# Patient Record
Sex: Male | Born: 1995 | Race: White | Hispanic: No | Marital: Single | Smoking: Never smoker
Health system: Southern US, Community
[De-identification: ages and names within clinical notes are randomized; demographics above are authoritative.]

## PROBLEM LIST (undated history)

## (undated) DIAGNOSIS — Z9189 Other specified personal risk factors, not elsewhere classified: Secondary | ICD-10-CM

## (undated) DIAGNOSIS — L709 Acne, unspecified: Secondary | ICD-10-CM

## (undated) DIAGNOSIS — R319 Hematuria, unspecified: Secondary | ICD-10-CM

## (undated) DIAGNOSIS — J45909 Unspecified asthma, uncomplicated: Secondary | ICD-10-CM

## (undated) DIAGNOSIS — J302 Other seasonal allergic rhinitis: Secondary | ICD-10-CM

## (undated) DIAGNOSIS — F909 Attention-deficit hyperactivity disorder, unspecified type: Secondary | ICD-10-CM

## (undated) HISTORY — PX: ADENOIDECTOMY: SUR15

## (undated) HISTORY — PX: MYRINGOTOMY WITH TUBE PLACEMENT: SHX5663

## (undated) HISTORY — PX: TONSILLECTOMY: SUR1361

---
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
## 2014-04-30 ENCOUNTER — Other Ambulatory Visit (HOSPITAL_COMMUNITY): Payer: Self-pay | Admitting: Gastroenterology

## 2014-04-30 DIAGNOSIS — R112 Nausea with vomiting, unspecified: Secondary | ICD-10-CM

## 2014-05-13 ENCOUNTER — Ambulatory Visit (HOSPITAL_COMMUNITY): Payer: Self-pay

## 2014-05-29 ENCOUNTER — Ambulatory Visit (HOSPITAL_COMMUNITY): Payer: Self-pay | Attending: Gastroenterology

## 2014-09-13 ENCOUNTER — Encounter (HOSPITAL_BASED_OUTPATIENT_CLINIC_OR_DEPARTMENT_OTHER): Payer: Self-pay | Admitting: Emergency Medicine

## 2014-09-13 ENCOUNTER — Emergency Department (HOSPITAL_BASED_OUTPATIENT_CLINIC_OR_DEPARTMENT_OTHER): Payer: BLUE CROSS/BLUE SHIELD

## 2014-09-13 ENCOUNTER — Emergency Department (HOSPITAL_BASED_OUTPATIENT_CLINIC_OR_DEPARTMENT_OTHER)
Admission: EM | Admit: 2014-09-13 | Discharge: 2014-09-14 | Disposition: A | Discharge status: Home / Self Care | Payer: BLUE CROSS/BLUE SHIELD | Attending: Emergency Medicine | Admitting: Emergency Medicine

## 2014-09-13 DIAGNOSIS — Y998 Other external cause status: Secondary | ICD-10-CM | POA: Diagnosis not present

## 2014-09-13 DIAGNOSIS — W228XXA Striking against or struck by other objects, initial encounter: Secondary | ICD-10-CM | POA: Diagnosis not present

## 2014-09-13 DIAGNOSIS — Z79899 Other long term (current) drug therapy: Secondary | ICD-10-CM | POA: Insufficient documentation

## 2014-09-13 DIAGNOSIS — F909 Attention-deficit hyperactivity disorder, unspecified type: Secondary | ICD-10-CM | POA: Diagnosis not present

## 2014-09-13 DIAGNOSIS — S060X0A Concussion without loss of consciousness, initial encounter: Secondary | ICD-10-CM | POA: Insufficient documentation

## 2014-09-13 DIAGNOSIS — Y9389 Activity, other specified: Secondary | ICD-10-CM | POA: Insufficient documentation

## 2014-09-13 DIAGNOSIS — J45909 Unspecified asthma, uncomplicated: Secondary | ICD-10-CM | POA: Insufficient documentation

## 2014-09-13 DIAGNOSIS — S199XXA Unspecified injury of neck, initial encounter: Secondary | ICD-10-CM | POA: Insufficient documentation

## 2014-09-13 DIAGNOSIS — Y9289 Other specified places as the place of occurrence of the external cause: Secondary | ICD-10-CM | POA: Diagnosis not present

## 2014-09-13 DIAGNOSIS — Z872 Personal history of diseases of the skin and subcutaneous tissue: Secondary | ICD-10-CM | POA: Insufficient documentation

## 2014-09-13 DIAGNOSIS — S0990XA Unspecified injury of head, initial encounter: Secondary | ICD-10-CM | POA: Diagnosis present

## 2014-09-13 MED ORDER — IBUPROFEN 800 MG PO TABS
800.0000 mg | ORAL_TABLET | Freq: Once | ORAL | Status: AC
  Administered 2014-09-13: 800 mg via ORAL
  Filled 2014-09-13: qty 1
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
## 2014-09-13 NOTE — ED Notes (Signed)
Patient reports that he hit his head on the cement while doing a back flip. Reports that he is having Headache and neck pain after landing on his head. Reports that he has LOC, Reports that this happened about 2 hours ago and since he has had some confusion

## 2014-09-14 NOTE — Discharge Instructions (Signed)
Concussion  A concussion, or closed-head injury, is a brain injury caused by a direct blow to the head or by a quick and sudden movement (jolt) of the head or neck. Concussions are usually not life-threatening. Even so, the effects of a concussion can be serious. If you have had a concussion before, you are more likely to experience concussion-like symptoms after a direct blow to the head.   CAUSES  · Direct blow to the head, such as from running into another player during a soccer game, being hit in a fight, or hitting your head on a hard surface.  · A jolt of the head or neck that causes the brain to move back and forth inside the skull, such as in a car crash.  SIGNS AND SYMPTOMS  The signs of a concussion can be hard to notice. Early on, they may be missed by you, family members, and health care providers. You may look fine but act or feel differently.  Symptoms are usually temporary, but they may last for days, weeks, or even longer. Some symptoms may appear right away while others may not show up for hours or days. Every head injury is different. Symptoms include:  · Mild to moderate headaches that will not go away.  · A feeling of pressure inside your head.  · Having more trouble than usual:  ¨ Learning or remembering things you have heard.  ¨ Answering questions.  ¨ Paying attention or concentrating.  ¨ Organizing daily tasks.  ¨ Making decisions and solving problems.  · Slowness in thinking, acting or reacting, speaking, or reading.  · Getting lost or being easily confused.  · Feeling tired all the time or lacking energy (fatigued).  · Feeling drowsy.  · Sleep disturbances.  ¨ Sleeping more than usual.  ¨ Sleeping less than usual.  ¨ Trouble falling asleep.  ¨ Trouble sleeping (insomnia).  · Loss of balance or feeling lightheaded or dizzy.  · Nausea or vomiting.  · Numbness or tingling.  · Increased sensitivity to:  ¨ Sounds.  ¨ Lights.  ¨ Distractions.  · Vision problems or eyes that tire  easily.  · Diminished sense of taste or smell.  · Ringing in the ears.  · Mood changes such as feeling sad or anxious.  · Becoming easily irritated or angry for little or no reason.  · Lack of motivation.  · Seeing or hearing things other people do not see or hear (hallucinations).  DIAGNOSIS  Your health care provider can usually diagnose a concussion based on a description of your injury and symptoms. He or she will ask whether you passed out (lost consciousness) and whether you are having trouble remembering events that happened right before and during your injury.  Your evaluation might include:  · A brain scan to look for signs of injury to the brain. Even if the test shows no injury, you may still have a concussion.  · Blood tests to be sure other problems are not present.  TREATMENT  · Concussions are usually treated in an emergency department, in urgent care, or at a clinic. You may need to stay in the hospital overnight for further treatment.  · Tell your health care provider if you are taking any medicines, including prescription medicines, over-the-counter medicines, and natural remedies. Some medicines, such as blood thinners (anticoagulants) and aspirin, may increase the chance of complications. Also tell your health care provider whether you have had alcohol or are taking illegal drugs. This information   may affect treatment.  · Your health care provider will send you home with important instructions to follow.  · How fast you will recover from a concussion depends on many factors. These factors include how severe your concussion is, what part of your brain was injured, your age, and how healthy you were before the concussion.  · Most people with mild injuries recover fully. Recovery can take time. In general, recovery is slower in older persons. Also, persons who have had a concussion in the past or have other medical problems may find that it takes longer to recover from their current injury.  HOME  CARE INSTRUCTIONS  General Instructions  · Carefully follow the directions your health care provider gave you.  · Only take over-the-counter or prescription medicines for pain, discomfort, or fever as directed by your health care provider.  · Take only those medicines that your health care provider has approved.  · Do not drink alcohol until your health care provider says you are well enough to do so. Alcohol and certain other drugs may slow your recovery and can put you at risk of further injury.  · If it is harder than usual to remember things, write them down.  · If you are easily distracted, try to do one thing at a time. For example, do not try to watch TV while fixing dinner.  · Talk with family members or close friends when making important decisions.  · Keep all follow-up appointments. Repeated evaluation of your symptoms is recommended for your recovery.  · Watch your symptoms and tell others to do the same. Complications sometimes occur after a concussion. Older adults with a brain injury may have a higher risk of serious complications, such as a blood clot on the brain.  · Tell your teachers, school nurse, school counselor, coach, athletic trainer, or work manager about your injury, symptoms, and restrictions. Tell them about what you can or cannot do. They should watch for:  ¨ Increased problems with attention or concentration.  ¨ Increased difficulty remembering or learning new information.  ¨ Increased time needed to complete tasks or assignments.  ¨ Increased irritability or decreased ability to cope with stress.  ¨ Increased symptoms.  · Rest. Rest helps the brain to heal. Make sure you:  ¨ Get plenty of sleep at night. Avoid staying up late at night.  ¨ Keep the same bedtime hours on weekends and weekdays.  ¨ Rest during the day. Take daytime naps or rest breaks when you feel tired.  · Limit activities that require a lot of thought or concentration. These include:  ¨ Doing homework or job-related  work.  ¨ Watching TV.  ¨ Working on the computer.  · Avoid any situation where there is potential for another head injury (football, hockey, soccer, basketball, martial arts, downhill snow sports and horseback riding). Your condition will get worse every time you experience a concussion. You should avoid these activities until you are evaluated by the appropriate follow-up health care providers.  Returning To Your Regular Activities  You will need to return to your normal activities slowly, not all at once. You must give your body and brain enough time for recovery.  · Do not return to sports or other athletic activities until your health care provider tells you it is safe to do so.  · Ask your health care provider when you can drive, ride a bicycle, or operate heavy machinery. Your ability to react may be slower after a   brain injury. Never do these activities if you are dizzy.  · Ask your health care provider about when you can return to work or school.  Preventing Another Concussion  It is very important to avoid another brain injury, especially before you have recovered. In rare cases, another injury can lead to permanent brain damage, brain swelling, or death. The risk of this is greatest during the first 7-10 days after a head injury. Avoid injuries by:  · Wearing a seat belt when riding in a car.  · Drinking alcohol only in moderation.  · Wearing a helmet when biking, skiing, skateboarding, skating, or doing similar activities.  · Avoiding activities that could lead to a second concussion, such as contact or recreational sports, until your health care provider says it is okay.  · Taking safety measures in your home.  ¨ Remove clutter and tripping hazards from floors and stairways.  ¨ Use grab bars in bathrooms and handrails by stairs.  ¨ Place non-slip mats on floors and in bathtubs.  ¨ Improve lighting in dim areas.  SEEK MEDICAL CARE IF:  · You have increased problems paying attention or  concentrating.  · You have increased difficulty remembering or learning new information.  · You need more time to complete tasks or assignments than before.  · You have increased irritability or decreased ability to cope with stress.  · You have more symptoms than before.  Seek medical care if you have any of the following symptoms for more than 2 weeks after your injury:  · Lasting (chronic) headaches.  · Dizziness or balance problems.  · Nausea.  · Vision problems.  · Increased sensitivity to noise or light.  · Depression or mood swings.  · Anxiety or irritability.  · Memory problems.  · Difficulty concentrating or paying attention.  · Sleep problems.  · Feeling tired all the time.  SEEK IMMEDIATE MEDICAL CARE IF:  · You have severe or worsening headaches. These may be a sign of a blood clot in the brain.  · You have weakness (even if only in one hand, leg, or part of the face).  · You have numbness.  · You have decreased coordination.  · You vomit repeatedly.  · You have increased sleepiness.  · One pupil is larger than the other.  · You have convulsions.  · You have slurred speech.  · You have increased confusion. This may be a sign of a blood clot in the brain.  · You have increased restlessness, agitation, or irritability.  · You are unable to recognize people or places.  · You have neck pain.  · It is difficult to wake you up.  · You have unusual behavior changes.  · You lose consciousness.  MAKE SURE YOU:  · Understand these instructions.  · Will watch your condition.  · Will get help right away if you are not doing well or get worse.  Document Released: 03/12/2003 Document Revised: 12/25/2012 Document Reviewed: 07/12/2012  Exit[employer]® Patient Information ©2015 [employer]. This information is not intended to replace advice given to you by your health care provider. Make sure you discuss any questions you have with your health care provider.

## 2015-03-15 ENCOUNTER — Encounter (HOSPITAL_COMMUNITY): Payer: Self-pay | Admitting: Emergency Medicine

## 2015-03-15 ENCOUNTER — Emergency Department (HOSPITAL_COMMUNITY): Payer: BLUE CROSS/BLUE SHIELD

## 2015-03-15 ENCOUNTER — Emergency Department (HOSPITAL_COMMUNITY)
Admission: EM | Admit: 2015-03-15 | Discharge: 2015-03-15 | Disposition: A | Discharge status: Home / Self Care | Payer: BLUE CROSS/BLUE SHIELD | Attending: Emergency Medicine | Admitting: Emergency Medicine

## 2015-03-15 DIAGNOSIS — W01198A Fall on same level from slipping, tripping and stumbling with subsequent striking against other object, initial encounter: Secondary | ICD-10-CM | POA: Diagnosis not present

## 2015-03-15 DIAGNOSIS — J45909 Unspecified asthma, uncomplicated: Secondary | ICD-10-CM | POA: Insufficient documentation

## 2015-03-15 DIAGNOSIS — Y9289 Other specified places as the place of occurrence of the external cause: Secondary | ICD-10-CM | POA: Insufficient documentation

## 2015-03-15 DIAGNOSIS — Y9389 Activity, other specified: Secondary | ICD-10-CM | POA: Insufficient documentation

## 2015-03-15 DIAGNOSIS — Y998 Other external cause status: Secondary | ICD-10-CM | POA: Insufficient documentation

## 2015-03-15 DIAGNOSIS — Z872 Personal history of diseases of the skin and subcutaneous tissue: Secondary | ICD-10-CM | POA: Diagnosis not present

## 2015-03-15 DIAGNOSIS — S52591A Other fractures of lower end of right radius, initial encounter for closed fracture: Secondary | ICD-10-CM | POA: Insufficient documentation

## 2015-03-15 DIAGNOSIS — S6991XA Unspecified injury of right wrist, hand and finger(s), initial encounter: Secondary | ICD-10-CM | POA: Diagnosis present

## 2015-03-15 DIAGNOSIS — Z79899 Other long term (current) drug therapy: Secondary | ICD-10-CM | POA: Insufficient documentation

## 2015-03-15 DIAGNOSIS — F909 Attention-deficit hyperactivity disorder, unspecified type: Secondary | ICD-10-CM | POA: Diagnosis not present

## 2015-03-15 DIAGNOSIS — S5291XA Unspecified fracture of right forearm, initial encounter for closed fracture: Secondary | ICD-10-CM

## 2015-03-15 NOTE — ED Notes (Signed)
Patient transported to X-ray 

## 2015-03-15 NOTE — ED Notes (Signed)
Pt reports that he broke his right wrist Thursday and has had numbness since. Today pt has no feeling to right pointer finger and right middle finger. Pt has splint to right arm.

## 2015-03-15 NOTE — Consult Note (Signed)
Reason for Consult:right  distal radius fracture Referring Physician: Hassie Bruceickering  Jeffery Morales is an 20 y.o. male.  HPI: s/p fall of porch last Thursday while [place] [facility] with reduction of distal radius fracture now with worsening pain and numbness  Past Medical History  Diagnosis Date  . ADHD (attention deficit hyperactivity disorder)     ADD  . Asthma     hx of asthma, has albuterol that he uses w/ assoc bronchitis  . At risk for impaired digestion     has an appt to see gastroenterologist  . Hematuria     w/ dysuria  . Acne   . Seasonal allergies     Past Surgical History  Procedure Laterality Date  . Adenoidectomy    . Tonsillectomy    . Myringotomy with tube placement    . Cystoscopy with urethral dilatation N/A 02/27/2012    Procedure: CYSTOSCOPY WITH URETHRAL DILATATION;  Surgeon: Valetta Fulleravid S Grapey, MD;  [facility];  Service: Urology;  Laterality: N/A;  30 MIN  URETHRAL DILATION     Family History  Problem Relation Age of Onset  . Hypertension Mother   . Heart disease Maternal Grandmother   . Hyperlipidemia Maternal Grandmother   . Diabetes Maternal Grandmother   . Heart disease Maternal Grandfather   . Hyperlipidemia Maternal Grandfather   . Diabetes Maternal Grandfather     Social History:  reports that he has never smoked. He does not have any smokeless tobacco history on file. He reports that he drinks alcohol. His drug history is not on file.  Allergies: No Known Allergies  Medications: Scheduled:  No results found for this or any previous visit (from the past 48 hour(s)).  Dg Wrist Complete Right  03/15/2015  CLINICAL DATA:  Wrist fracture 4 days ago when fell from balcony. No missed index finger and middle finger. In a soft cast. EXAM: RIGHT WRIST - COMPLETE 3+ VIEW COMPARISON:  None. FINDINGS: In cast views of the right wrist demonstrate comminuted intra-articular distal right radial fracture. The distal fragments are  displaced posteriorly approximately 9 mm. No subluxation or dislocation. IMPRESSION: Significantly displaced and comminuted distal right radial fracture as above. Electronically Signed   By: Charlett NoseKevin  Dover M.D.   On: 03/15/2015 17:51    Review of Systems  All other systems reviewed and are negative.  Blood pressure 143/110, pulse 51, temperature 97.7 F (36.5 C), temperature source Oral, resp. rate 18, height 5\' 11"  (1.803 m), weight 91.882 kg (202 lb 9 oz), SpO2 98 %. Physical Exam  Constitutional: He is oriented to person, place, and time. He appears well-developed and well-nourished.  HENT:  Head: Normocephalic and atraumatic.  Neck: Normal range of motion.  Cardiovascular: Normal rate.   Respiratory: Effort normal.  Musculoskeletal:       Right wrist: He exhibits tenderness, bony tenderness and swelling.  Dorsally displaced right distal radius fracture with increasing numbness in median distribution over last 24 hours  Neurological: He is alert and oriented to person, place, and time.  Skin: Skin is warm.  Psychiatric: He has a normal mood and affect. His behavior is normal. Judgment and thought content normal.    Assessment/Plan: As above  Will loosen splint and schedule for ORIF/CTR tomorrow as outpatient  Patient should be NPO after midnight tonight and call my office Monday at 845 and ask for Helmut Musterlicia  She will instruct patient in surgical place and time  [facility],Kier Smead A 03/15/2015, 6:43 PM

## 2015-03-15 NOTE — Discharge Instructions (Signed)
Forearm Fracture A forearm fracture is a break in one or both of the bones of your arm that are between the elbow and the wrist. Your forearm is made up of two bones called the radius and the ulna. Some forearm fractures will require surgery. HOME CARE If You Have a Cast:  Do not stick anything inside the cast to scratch your skin.  Check the skin around the cast every day. Tell your doctor about any concerns. You may put lotion on dry skin around the edges of the cast, but not on the skin underneath the cast. If You Have a Splint:  Wear it as told by your doctor. Remove it only as told by your doctor.  Loosen the splint if your fingers become numb and tingle, or if they turn cold and blue. Bathing  Cover the cast or splint with a watertight plastic bag to protect it from water while you take a bath or a shower. Do not let the cast or splint get wet. Managing Pain, Stiffness, and Swelling  If told, apply ice to the injured area:  Put ice in a plastic bag.  Place a towel between your skin and the bag.  Leave the ice on for 20 minutes, 2-3 times a day.  Move your fingers often to avoid stiffness and to lessen swelling.  Raise the injured area above the level of your heart while you are sitting or lying down. Driving  Do not drive or use heavy machinery while taking pain medicine.  Do not drive while wearing a cast or splint on a hand that you use for driving. Activity  Return to your normal activities as told by your doctor. Ask your doctor what activities are safe for you.  Do range-of-motion exercises only as told by your doctor. Safety  Do not use your injured limb to support your body weight until your doctor says that you can. General Instructions  Do not put pressure on any part of the cast or splint until it is fully hardened. This may take several hours.  Keep the cast or splint clean and dry.  Do not use any tobacco products, including cigarettes, chewing  tobacco, or electronic cigarettes. Tobacco can delay bone healing. If you need help quitting, ask your doctor.  Take medicines only as told by your doctor.  Keep all follow-up visits as told by your doctor. This is important. GET HELP IF:  Your pain medicine is not helping.  Your cast breaks or gets damaged.  Your cast gets loose.  Your cast feels too tight.  Your cast gets wet.  You have more severe pain or swelling than you did before the cast.  You have severe pain when you stretch your fingers.  You continue to have pain or stiffness in your elbow or your wrist after your cast is taken off. GET HELP RIGHT AWAY IF:   You cannot move your fingers.  You lose feeling in your fingers or your hand.  Your hand or your fingers turn cold and pale or blue.  You notice a bad smell coming from your cast.  You have fluid or drainage from underneath your cast.  You have new stains from blood, fluid, or drainage that is coming through your cast.   This information is not intended to replace advice given to you by your health care provider. Make sure you discuss any questions you have with your health care provider.   Contact Dr. Mina MarbleWeingold office tomorrow morning at  8:45 AM. Ask for Helmut Muster. Your surgery will be scheduled for later tomorrow afternoon. Continue taking home pain medication as needed. Keep arm elevated as much as possible. Return to the ED if you experience fever, severe worsening of your symptoms, inability to move your fingers.

## 2015-03-15 NOTE — ED Notes (Signed)
Dr. Mina MarbleWeingold coming to see patient.

## 2015-03-15 NOTE — ED Provider Notes (Signed)
CSN: [id number]     Arrival date & time 03/15/15  1543 History   First MD Initiated Contact with Patient 03/15/15 1600     Chief Complaint  Patient presents with  . Numbness     (Consider location/radiation/quality/duration/timing/severity/associated sxs/prior Treatment) HPI  Jeffery Morales is a 20 y.o M With no significant past medical history who presents to the emergency department today complaining of wrist pain and finger numbness. Patient states that 5 days ago he was on spring break in [place] and he fell on an outstretched hand and broke his right wrist. While intoxicated he attempted to reset his wrist. The following day he was seen [place] clinic in [place] who reduced the fracture and placed a splint on it. Patient was told that he may require surgery. S he lives in [place] he came home to follow-up with a hand surgeon here but has not done so yet. Yesterday patient states that his hand became much more swollen and is now having complete numbness in his right middle finger. Patient has been taking oxycodone and ibuprofen at home with minimal relief of his symptoms.   Past Medical History  Diagnosis Date  . ADHD (attention deficit hyperactivity disorder)     ADD  . Asthma     hx of asthma, has albuterol that he uses w/ assoc bronchitis  . At risk for impaired digestion     has an appt to see gastroenterologist  . Hematuria     w/ dysuria  . Acne   . Seasonal allergies    Past Surgical History  Procedure Laterality Date  . Adenoidectomy    . Tonsillectomy    . Myringotomy with tube placement    . Cystoscopy with urethral dilatation N/A 02/27/2012    Procedure: CYSTOSCOPY WITH URETHRAL DILATATION;  Surgeon: Valetta Fulleravid S Grapey, MD;  [facility];  Service: Urology;  Laterality: N/A;  30 MIN  URETHRAL DILATION    Family History  Problem Relation Age of Onset  . Hypertension Mother   . Heart disease Maternal Grandmother   .  Hyperlipidemia Maternal Grandmother   . Diabetes Maternal Grandmother   . Heart disease Maternal Grandfather   . Hyperlipidemia Maternal Grandfather   . Diabetes Maternal Grandfather    Social History  Substance Use Topics  . Smoking status: Never Smoker   . Smokeless tobacco: None  . Alcohol Use: Yes     Comment: per mom    Review of Systems  All other systems reviewed and are negative.     Allergies  Review of patient's allergies indicates no known allergies.  Home Medications   Prior to Admission medications   Medication Sig Start Date End Date Taking? Authorizing Provider  albuterol-ipratropium (COMBIVENT) 18-103 MCG/ACT inhaler Inhale 2 puffs into the lungs every 6 (six) hours as needed for wheezing.    Historical Provider, MD  buPROPion (WELLBUTRIN XL) 300 MG 24 hr tablet Take 300 mg by mouth daily.    Historical Provider, MD  cetirizine (ZYRTEC) 10 MG tablet Take 10 mg by mouth daily.    Historical Provider, MD  lansoprazole (PREVACID) 15 MG capsule Take 15 mg by mouth daily.    Historical Provider, MD  methylphenidate (RITALIN) 10 MG tablet Take 10 mg by mouth at bedtime as needed.    Historical Provider, MD  sertraline (ZOLOFT) 50 MG tablet Take 50 mg by mouth daily.    Historical Provider, MD   BP 143/110 mmHg  Pulse 51  Temp(Src) 97.7 F (36.5 C) (Oral)  Resp 18  Ht  (1.803 m)  Wt 91.882 kg  BMI 28.26 kg/m2  SpO2 98% Physical Exam  Constitutional: He is oriented to person, place, and time. He appears well-developed and well-nourished. No distress.  HENT:  Head: Normocephalic and atraumatic.  Eyes: Conjunctivae are normal. Right eye exhibits no discharge. Left eye exhibits no discharge. No scleral icterus.  Cardiovascular: Normal rate.   Pulmonary/Chest: Effort normal.  Musculoskeletal:  Obvious bony deformity of right wrist with volar injury. Significant swelling of digits with ecchymoses. Intact distal pulses. Good cap refill. Decreased sensation  in right fourth digit. Complete lack of light touch and pressure sensation in right middle digit. Sharp sensation intact. No evidence of tendon injury. No elbow tenderness.  Neurological: He is alert and oriented to person, place, and time. Coordination normal.  Skin: Skin is warm and dry. No rash noted. He is not diaphoretic. No erythema. No pallor.  Psychiatric: He has a normal mood and affect. His behavior is normal.  Nursing note and vitals reviewed.   ED Course  Procedures (including critical care time) Labs Review Labs Reviewed - No data to display  Imaging Review Dg Wrist Complete Right  03/15/2015  CLINICAL DATA:  Wrist fracture 4 days ago when fell from balcony. No missed index finger and middle finger. In a soft cast. EXAM: RIGHT WRIST - COMPLETE 3+ VIEW COMPARISON:  None. FINDINGS: In cast views of the right wrist demonstrate comminuted intra-articular distal right radial fracture. The distal fragments are displaced posteriorly approximately 9 mm. No subluxation or dislocation. IMPRESSION: Significantly displaced and comminuted distal right radial fracture as above. Electronically Signed   By: Charlett Nose M.D.   On: 03/15/2015 17:51   I have personally reviewed and evaluated these images and lab results as part of my medical decision-making.   EKG Interpretation None      MDM   Final diagnoses:  Radial fracture, right, closed, initial encounter    X-ray repeated in the ED. Which reveals significantly displaced and comminuted distal right radial fracture. Patient does have good cap refill. Possible median nerve damage as his right index finger has significantly decreased sensation. Consulted Dr.Weingold who saw the patient on the emergency department. Patient is scheduled to have surgery tomorrow afternoon. Splint was readjusted today while in the ED. Patient has home narcotics to take. Return precautions outlined in patient discharge instructions.  Case discussed with Dr.  Rubin Payor who agrees with treatment plan.    Lester Kinsman Lowell, PA-C 03/16/15 1620  Benjiman Core, MD 03/17/15 [phone]

## 2020-01-06 DIAGNOSIS — J209 Acute bronchitis, unspecified: Secondary | ICD-10-CM | POA: Diagnosis not present

## 2020-02-04 DIAGNOSIS — S60222A Contusion of left hand, initial encounter: Secondary | ICD-10-CM | POA: Diagnosis not present

## 2020-02-13 ENCOUNTER — Other Ambulatory Visit: Payer: Self-pay | Admitting: Physician Assistant

## 2020-02-13 DIAGNOSIS — K861 Other chronic pancreatitis: Secondary | ICD-10-CM

## 2020-02-13 DIAGNOSIS — R1013 Epigastric pain: Secondary | ICD-10-CM

## 2020-02-13 DIAGNOSIS — E781 Pure hyperglyceridemia: Secondary | ICD-10-CM | POA: Diagnosis not present

## 2020-02-13 DIAGNOSIS — R112 Nausea with vomiting, unspecified: Secondary | ICD-10-CM

## 2020-02-19 DIAGNOSIS — K861 Other chronic pancreatitis: Secondary | ICD-10-CM | POA: Diagnosis not present

## 2020-03-08 ENCOUNTER — Other Ambulatory Visit: Payer: BLUE CROSS/BLUE SHIELD

## 2020-03-29 ENCOUNTER — Other Ambulatory Visit: Payer: Self-pay

## 2020-03-29 ENCOUNTER — Ambulatory Visit
Admission: RE | Admit: 2020-03-29 | Discharge: 2020-03-29 | Disposition: A | Discharge status: Home / Self Care | Payer: BC Managed Care – PPO | Source: Ambulatory Visit | Attending: Physician Assistant | Admitting: Physician Assistant

## 2020-03-29 DIAGNOSIS — N281 Cyst of kidney, acquired: Secondary | ICD-10-CM | POA: Diagnosis not present

## 2020-03-29 DIAGNOSIS — D1803 Hemangioma of intra-abdominal structures: Secondary | ICD-10-CM | POA: Diagnosis not present

## 2020-03-29 DIAGNOSIS — K862 Cyst of pancreas: Secondary | ICD-10-CM | POA: Diagnosis not present

## 2020-03-29 DIAGNOSIS — R1013 Epigastric pain: Secondary | ICD-10-CM

## 2020-03-29 DIAGNOSIS — K861 Other chronic pancreatitis: Secondary | ICD-10-CM

## 2020-03-29 DIAGNOSIS — R935 Abnormal findings on diagnostic imaging of other abdominal regions, including retroperitoneum: Secondary | ICD-10-CM | POA: Diagnosis not present

## 2020-03-29 DIAGNOSIS — R112 Nausea with vomiting, unspecified: Secondary | ICD-10-CM

## 2020-03-29 MED ORDER — GADOBENATE DIMEGLUMINE 529 MG/ML IV SOLN
17.0000 mL | Freq: Once | INTRAVENOUS | Status: AC | PRN
  Administered 2020-03-29: 17 mL via INTRAVENOUS

## 2020-04-30 ENCOUNTER — Other Ambulatory Visit (HOSPITAL_COMMUNITY): Payer: Self-pay | Admitting: Physician Assistant

## 2020-04-30 DIAGNOSIS — R1013 Epigastric pain: Secondary | ICD-10-CM | POA: Diagnosis not present

## 2020-04-30 DIAGNOSIS — K649 Unspecified hemorrhoids: Secondary | ICD-10-CM | POA: Diagnosis not present

## 2020-04-30 DIAGNOSIS — D649 Anemia, unspecified: Secondary | ICD-10-CM | POA: Diagnosis not present

## 2020-04-30 DIAGNOSIS — K219 Gastro-esophageal reflux disease without esophagitis: Secondary | ICD-10-CM | POA: Diagnosis not present

## 2020-04-30 DIAGNOSIS — K625 Hemorrhage of anus and rectum: Secondary | ICD-10-CM | POA: Diagnosis not present

## 2020-05-15 ENCOUNTER — Encounter (HOSPITAL_COMMUNITY): Payer: Self-pay

## 2020-05-15 ENCOUNTER — Ambulatory Visit (HOSPITAL_COMMUNITY): Payer: BC Managed Care – PPO | Attending: Physician Assistant

## 2020-06-30 DIAGNOSIS — R1084 Generalized abdominal pain: Secondary | ICD-10-CM | POA: Diagnosis not present

## 2020-06-30 DIAGNOSIS — R110 Nausea: Secondary | ICD-10-CM | POA: Diagnosis not present

## 2020-06-30 DIAGNOSIS — K861 Other chronic pancreatitis: Secondary | ICD-10-CM | POA: Diagnosis not present

## 2020-08-03 DIAGNOSIS — R194 Change in bowel habit: Secondary | ICD-10-CM | POA: Diagnosis not present

## 2020-08-03 DIAGNOSIS — K219 Gastro-esophageal reflux disease without esophagitis: Secondary | ICD-10-CM | POA: Diagnosis not present

## 2020-08-03 DIAGNOSIS — R109 Unspecified abdominal pain: Secondary | ICD-10-CM | POA: Diagnosis not present

## 2020-09-15 ENCOUNTER — Ambulatory Visit: Payer: BC Managed Care – PPO | Admitting: Behavioral Health

## 2020-09-28 ENCOUNTER — Other Ambulatory Visit: Payer: Self-pay

## 2020-09-28 ENCOUNTER — Ambulatory Visit (INDEPENDENT_AMBULATORY_CARE_PROVIDER_SITE_OTHER): Payer: BC Managed Care – PPO | Admitting: Behavioral Health

## 2020-09-28 ENCOUNTER — Encounter: Payer: Self-pay | Admitting: Behavioral Health

## 2020-09-28 VITALS — BP 113/76 | HR 57 | Ht 71.0 in | Wt 194.0 lb

## 2020-09-28 DIAGNOSIS — F331 Major depressive disorder, recurrent, moderate: Secondary | ICD-10-CM

## 2020-09-28 DIAGNOSIS — F411 Generalized anxiety disorder: Secondary | ICD-10-CM

## 2020-09-28 DIAGNOSIS — F900 Attention-deficit hyperactivity disorder, predominantly inattentive type: Secondary | ICD-10-CM

## 2020-09-28 MED ORDER — DESVENLAFAXINE SUCCINATE ER 50 MG PO TB24: ORAL_TABLET | ORAL | 2 refills | Status: AC

## 2020-09-28 MED ORDER — METHYLPHENIDATE HCL ER (OSM) 18 MG PO TBCR: 18.0000 mg | EXTENDED_RELEASE_TABLET | Freq: Every day | ORAL | 0 refills | Status: DC | PRN

## 2020-09-28 MED ORDER — CLONAZEPAM 0.5 MG PO TABS: 0.5000 mg | ORAL_TABLET | Freq: Three times a day (TID) | ORAL | 1 refills | Status: DC | PRN

## 2020-09-28 NOTE — Progress Notes (Signed)
[facility] MD/PA/NP Initial Note  09/28/2020 4:35 PM Jeffery Morales  MRN:  [id number]  Chief Complaint:  Chief Complaint   Depression; Anxiety; Medication Problem; Establish Care; Patient Education     HPI:  25 year old male presents to this office for initial visit and to establish care. He says that he was previously getting psychiatric care in [place] while he was attending college. Say he recently moved back to the [place] area and just needed to establish care with a new provider to manage his medications. He says that he is stable and happy with his medications currently and does not want to make any changes. However he does need refills on his meds. He says that he was diagnosed with ADHD at young age but started to struggle with anxiety and depression while in college. He says he feels like most of his symptoms are under control. He says his anxiety today is 5/10 and depression is 4/10. He says he does sleep more than 8 hours per night. He endorses previous history of racing thoughts, lack of concentration, being more talkative than normal, irritability and increased interest in sex. He denies mania, no psychosis. No SI/HI.  Previous psychiatric medications: Wellbutrin Zoloft Prozac Luvox Pristiq    Visit Diagnosis:    ICD-10-CM   1. Generalized anxiety disorder  F41.1 clonazePAM (KLONOPIN) 0.5 MG tablet    desvenlafaxine (PRISTIQ) 50 MG 24 hr tablet    2. Major depressive disorder, recurrent episode, moderate (HCC)  F33.1 desvenlafaxine (PRISTIQ) 50 MG 24 hr tablet    3. Attention deficit hyperactivity disorder (ADHD), predominantly inattentive type  F90.0 methylphenidate 18 MG PO CR tablet      Past Psychiatric History: anxiety, depression   Past Medical History:  Past Medical History:  Diagnosis Date   Acne    ADHD (attention deficit hyperactivity disorder)    ADD   Asthma    hx of asthma, has albuterol that he uses w/ assoc bronchitis   At risk for impaired  digestion    has an appt to see gastroenterologist   Hematuria    w/ dysuria   Seasonal allergies     Past Surgical History:  Procedure Laterality Date   ADENOIDECTOMY     CYSTOSCOPY WITH URETHRAL DILATATION N/A 02/27/2012   Procedure: CYSTOSCOPY WITH URETHRAL DILATATION;  Surgeon: Valetta Fuller, MD;  Location: [facility];  Service: Urology;  Laterality: N/A;  30 MIN  URETHRAL DILATION    MYRINGOTOMY WITH TUBE PLACEMENT     TONSILLECTOMY      Family Psychiatric History: see chart   Family History:  Family History  Problem Relation Age of Onset   Hypertension Mother    Dementia Sister    Alcohol abuse Sister    Alcohol abuse Paternal Uncle    Heart disease Maternal Grandfather    Hyperlipidemia Maternal Grandfather    Diabetes Maternal Grandfather    Heart disease Maternal Grandmother    Hyperlipidemia Maternal Grandmother    Diabetes Maternal Grandmother    Alcohol abuse Paternal Grandfather     Social History:  Social History   Socioeconomic History   Marital status: Single    Spouse name: Not on file   Number of children: Not on file   Years of education: Not on file   Highest education level: Not on file  Occupational History   Not on file  Tobacco Use   Smoking status: Never   Smokeless tobacco: Never  Substance and Sexual Activity  Alcohol use: Yes    Comment: per mom   Drug use: Not Currently   Sexual activity: Not Currently    Comment: per mom  Other Topics Concern   Not on file  Social History Narrative   Not on file   Social Determinants of Health   Financial Resource Strain: Not on file  Food Insecurity: Not on file  Transportation Needs: Not on file  Physical Activity: Not on file  Stress: Not on file  Social Connections: Not on file    Allergies: No Known Allergies  Metabolic Disorder Labs: No results found for: HGBA1C, MPG No results found for: PROLACTIN No results found for: CHOL, TRIG, HDL, CHOLHDL, VLDL,  LDLCALC No results found for: TSH  Therapeutic Level Labs: No results found for: LITHIUM No results found for: VALPROATE No components found for:  CBMZ  Current Medications: Current Outpatient Medications  Medication Sig Dispense Refill   cetirizine (ZYRTEC) 10 MG tablet Take 10 mg by mouth daily.     dicyclomine (BENTYL) 10 MG capsule Take 10 mg by mouth every 6 (six) hours as needed.     fenofibrate micronized (LOFIBRA) 200 MG capsule Take 200 mg by mouth daily.     folic acid (FOLVITE) 1 MG tablet Take 1 tablet by mouth daily.     icosapent Ethyl (VASCEPA) 1 g capsule      metoprolol succinate (TOPROL-XL) 25 MG 24 hr tablet Take 1 tablet by mouth daily.     Multiple Vitamin (DAILY VITES) tablet Take 1 tablet by mouth daily.     omeprazole (PRILOSEC) 40 MG capsule Take 40 mg by mouth every morning.     ondansetron (ZOFRAN) 4 MG tablet Take 4 mg by mouth daily as needed.     rosuvastatin (CRESTOR) 20 MG tablet Take 1 tablet by mouth at bedtime.     albuterol-ipratropium (COMBIVENT) 18-103 MCG/ACT inhaler Inhale 2 puffs into the lungs every 6 (six) hours as needed for wheezing. (Patient not taking: Reported on 09/28/2020)     buPROPion (WELLBUTRIN XL) 300 MG 24 hr tablet Take 300 mg by mouth daily. (Patient not taking: Reported on 09/28/2020)     [START ON 10/15/2020] clonazePAM (KLONOPIN) 0.5 MG tablet Take 1 tablet (0.5 mg total) by mouth 3 (three) times daily as needed for anxiety. 30 tablet 1   desvenlafaxine (PRISTIQ) 50 MG 24 hr tablet SMARTSIG:1 Tablet(s) By Mouth Every Evening 30 tablet 2   lansoprazole (PREVACID) 15 MG capsule Take 15 mg by mouth daily. (Patient not taking: Reported on 09/28/2020)     methylphenidate (RITALIN) 10 MG tablet Take 10 mg by mouth at bedtime as needed. (Patient not taking: Reported on 09/28/2020)     [START ON 10/16/2020] methylphenidate 18 MG PO CR tablet Take 1 tablet (18 mg total) by mouth daily as needed. 30 tablet 0   sertraline (ZOLOFT) 50 MG tablet  Take 50 mg by mouth daily. (Patient not taking: Reported on 09/28/2020)     sucralfate (CARAFATE) 1 g tablet Take 1 g by mouth 2 (two) times daily.     traMADol (ULTRAM) 50 MG tablet Take 50 mg by mouth daily as needed.     ZENPEP 40000-126000 units CPEP Take by mouth.     No current facility-administered medications for this visit.    Medication Side Effects: none  Orders placed this visit:  No orders of the defined types were placed in this encounter.   Psychiatric Specialty Exam:  Review of Systems  Constitutional: Negative.  Gastrointestinal:  Positive for abdominal distention, blood in stool, constipation, diarrhea and nausea.  Neurological:  Positive for dizziness.  Hematological:  Bruises/bleeds easily.   Blood pressure 113/76, pulse (!) 57, height 5\' 11"  (1.803 m), weight 194 lb (88 kg).Body mass index is 27.06 kg/m.  General Appearance: Casual, Neat, and Well Groomed  Eye Contact:  Good  Speech:  Clear and Coherent and Talkative  Volume:  Normal  Mood:  Anxious  Affect:  Appropriate and Non-Congruent  Thought Process:  Coherent  Orientation:  Full (Time, Place, and Person)  Thought Content: Logical   Suicidal Thoughts:  No  Homicidal Thoughts:  No  Memory:  WNL  Judgement:  Good  Insight:  Good  Psychomotor Activity:  Normal  Concentration:  Concentration: Good  Recall:  Good  Fund of Knowledge: Good  Language: Good  Assets:  Desire for Improvement  ADL's:  Intact  Cognition: WNL  Prognosis:  Good   Screenings:  PHQ2-9    Flowsheet Row Office Visit from 09/28/2020 in [facility]  PHQ-2 Total Score 1       Receiving Psychotherapy: No   Treatment Plan/Recommendations:   Greater than 50% of  60 min face to face time with patient was spent on counseling and coordination of care. We discussed his long history of anxiety and depression. Discussed his previous psychiatric care plan while living in 09/30/2020. Continue Klonopin 0.5 mg three  times daily Continue Pristiq 50 mg tablet daily Continue Concerta 18 mg daily Will report  side effects or worsening symptoms promptly To follow up in 4 weeks to reassess Provided emergency contact information Discussed potential benefits, risks, and side effects of stimulants with patient to include increased heart rate, palpitations, insomnia, increased anxiety, increased irritability, or decreased appetite.  Instructed patient to contact office if experiencing any significant tolerability issues.  Discussed potential benefits, risk, and side effects of benzodiazepines to include potential risk of tolerance and dependence, as well as possible drowsiness.  Advised patient not to drive if experiencing drowsiness and to take lowest possible effective dose to minimize risk of dependence and tolerance.   Reviewed PDMP   [place], NP

## 2020-10-13 DIAGNOSIS — F900 Attention-deficit hyperactivity disorder, predominantly inattentive type: Secondary | ICD-10-CM | POA: Diagnosis not present

## 2020-10-13 DIAGNOSIS — F3342 Major depressive disorder, recurrent, in full remission: Secondary | ICD-10-CM | POA: Diagnosis not present

## 2020-10-13 DIAGNOSIS — F4001 Agoraphobia with panic disorder: Secondary | ICD-10-CM | POA: Diagnosis not present

## 2020-10-26 ENCOUNTER — Ambulatory Visit: Payer: BC Managed Care – PPO | Admitting: Behavioral Health

## 2020-12-01 DIAGNOSIS — F330 Major depressive disorder, recurrent, mild: Secondary | ICD-10-CM | POA: Diagnosis not present

## 2020-12-01 DIAGNOSIS — F411 Generalized anxiety disorder: Secondary | ICD-10-CM | POA: Diagnosis not present

## 2020-12-02 DIAGNOSIS — K861 Other chronic pancreatitis: Secondary | ICD-10-CM | POA: Diagnosis not present

## 2020-12-02 DIAGNOSIS — K219 Gastro-esophageal reflux disease without esophagitis: Secondary | ICD-10-CM | POA: Diagnosis not present

## 2020-12-02 DIAGNOSIS — E781 Pure hyperglyceridemia: Secondary | ICD-10-CM | POA: Diagnosis not present

## 2020-12-02 DIAGNOSIS — I10 Essential (primary) hypertension: Secondary | ICD-10-CM | POA: Diagnosis not present

## 2020-12-04 DIAGNOSIS — K861 Other chronic pancreatitis: Secondary | ICD-10-CM | POA: Diagnosis not present

## 2020-12-04 DIAGNOSIS — F32A Depression, unspecified: Secondary | ICD-10-CM | POA: Diagnosis not present

## 2020-12-04 DIAGNOSIS — Z79899 Other long term (current) drug therapy: Secondary | ICD-10-CM | POA: Diagnosis not present

## 2020-12-04 DIAGNOSIS — R7989 Other specified abnormal findings of blood chemistry: Secondary | ICD-10-CM | POA: Diagnosis not present

## 2020-12-04 DIAGNOSIS — F419 Anxiety disorder, unspecified: Secondary | ICD-10-CM | POA: Diagnosis not present

## 2020-12-04 DIAGNOSIS — R109 Unspecified abdominal pain: Secondary | ICD-10-CM | POA: Diagnosis not present

## 2020-12-11 DIAGNOSIS — R197 Diarrhea, unspecified: Secondary | ICD-10-CM | POA: Diagnosis not present

## 2020-12-11 DIAGNOSIS — R109 Unspecified abdominal pain: Secondary | ICD-10-CM | POA: Diagnosis not present

## 2020-12-11 DIAGNOSIS — R110 Nausea: Secondary | ICD-10-CM | POA: Diagnosis not present

## 2020-12-11 DIAGNOSIS — K2970 Gastritis, unspecified, without bleeding: Secondary | ICD-10-CM | POA: Diagnosis not present

## 2020-12-15 DIAGNOSIS — F900 Attention-deficit hyperactivity disorder, predominantly inattentive type: Secondary | ICD-10-CM | POA: Diagnosis not present

## 2020-12-15 DIAGNOSIS — F4001 Agoraphobia with panic disorder: Secondary | ICD-10-CM | POA: Diagnosis not present

## 2020-12-15 DIAGNOSIS — K869 Disease of pancreas, unspecified: Secondary | ICD-10-CM | POA: Diagnosis not present

## 2020-12-15 DIAGNOSIS — F3342 Major depressive disorder, recurrent, in full remission: Secondary | ICD-10-CM | POA: Diagnosis not present

## 2021-01-06 DIAGNOSIS — K2970 Gastritis, unspecified, without bleeding: Secondary | ICD-10-CM | POA: Diagnosis not present

## 2021-01-06 DIAGNOSIS — E781 Pure hyperglyceridemia: Secondary | ICD-10-CM | POA: Diagnosis not present

## 2021-01-06 DIAGNOSIS — K861 Other chronic pancreatitis: Secondary | ICD-10-CM | POA: Diagnosis not present

## 2021-01-06 DIAGNOSIS — K219 Gastro-esophageal reflux disease without esophagitis: Secondary | ICD-10-CM | POA: Diagnosis not present

## 2021-02-16 DIAGNOSIS — H5712 Ocular pain, left eye: Secondary | ICD-10-CM | POA: Diagnosis not present

## 2021-02-16 DIAGNOSIS — H53142 Visual discomfort, left eye: Secondary | ICD-10-CM | POA: Diagnosis not present

## 2021-03-03 DIAGNOSIS — M79644 Pain in right finger(s): Secondary | ICD-10-CM | POA: Diagnosis not present

## 2021-03-17 DIAGNOSIS — F331 Major depressive disorder, recurrent, moderate: Secondary | ICD-10-CM | POA: Diagnosis not present

## 2021-03-30 DIAGNOSIS — F331 Major depressive disorder, recurrent, moderate: Secondary | ICD-10-CM | POA: Diagnosis not present

## 2021-03-30 DIAGNOSIS — F900 Attention-deficit hyperactivity disorder, predominantly inattentive type: Secondary | ICD-10-CM | POA: Diagnosis not present

## 2021-03-30 DIAGNOSIS — F411 Generalized anxiety disorder: Secondary | ICD-10-CM | POA: Diagnosis not present

## 2021-04-04 DIAGNOSIS — S0591XA Unspecified injury of right eye and orbit, initial encounter: Secondary | ICD-10-CM | POA: Diagnosis not present

## 2021-04-27 DIAGNOSIS — F331 Major depressive disorder, recurrent, moderate: Secondary | ICD-10-CM | POA: Diagnosis not present

## 2021-04-27 DIAGNOSIS — F900 Attention-deficit hyperactivity disorder, predominantly inattentive type: Secondary | ICD-10-CM | POA: Diagnosis not present

## 2021-04-27 DIAGNOSIS — F411 Generalized anxiety disorder: Secondary | ICD-10-CM | POA: Diagnosis not present

## 2021-05-14 DIAGNOSIS — M6588 Other synovitis and tenosynovitis, other site: Secondary | ICD-10-CM | POA: Diagnosis not present

## 2021-05-27 DIAGNOSIS — S0500XA Injury of conjunctiva and corneal abrasion without foreign body, unspecified eye, initial encounter: Secondary | ICD-10-CM | POA: Diagnosis not present

## 2021-06-18 DIAGNOSIS — F13239 Sedative, hypnotic or anxiolytic dependence with withdrawal, unspecified: Secondary | ICD-10-CM | POA: Diagnosis not present

## 2021-06-18 DIAGNOSIS — Z79899 Other long term (current) drug therapy: Secondary | ICD-10-CM | POA: Diagnosis not present

## 2021-06-18 DIAGNOSIS — R112 Nausea with vomiting, unspecified: Secondary | ICD-10-CM | POA: Diagnosis not present

## 2021-06-18 DIAGNOSIS — F411 Generalized anxiety disorder: Secondary | ICD-10-CM | POA: Diagnosis not present

## 2021-06-18 DIAGNOSIS — F1320 Sedative, hypnotic or anxiolytic dependence, uncomplicated: Secondary | ICD-10-CM | POA: Diagnosis not present

## 2021-06-18 DIAGNOSIS — F419 Anxiety disorder, unspecified: Secondary | ICD-10-CM | POA: Diagnosis not present

## 2021-06-18 DIAGNOSIS — F1523 Other stimulant dependence with withdrawal: Secondary | ICD-10-CM | POA: Diagnosis not present

## 2021-06-18 DIAGNOSIS — R443 Hallucinations, unspecified: Secondary | ICD-10-CM | POA: Diagnosis not present

## 2021-06-21 DIAGNOSIS — F411 Generalized anxiety disorder: Secondary | ICD-10-CM | POA: Diagnosis not present

## 2021-06-21 DIAGNOSIS — F1310 Sedative, hypnotic or anxiolytic abuse, uncomplicated: Secondary | ICD-10-CM | POA: Diagnosis not present

## 2021-06-21 DIAGNOSIS — F900 Attention-deficit hyperactivity disorder, predominantly inattentive type: Secondary | ICD-10-CM | POA: Diagnosis not present

## 2021-06-21 DIAGNOSIS — F13239 Sedative, hypnotic or anxiolytic dependence with withdrawal, unspecified: Secondary | ICD-10-CM | POA: Diagnosis not present

## 2021-06-22 ENCOUNTER — Encounter (HOSPITAL_COMMUNITY): Payer: Self-pay | Admitting: Student

## 2021-06-22 ENCOUNTER — Other Ambulatory Visit (HOSPITAL_COMMUNITY)
Admission: EM | Admit: 2021-06-22 | Discharge: 2021-06-25 | Disposition: A | Discharge status: Home / Self Care | Payer: No Payment, Other | Attending: Psychiatry | Admitting: Psychiatry

## 2021-06-22 DIAGNOSIS — F411 Generalized anxiety disorder: Secondary | ICD-10-CM

## 2021-06-22 DIAGNOSIS — F13930 Sedative, hypnotic or anxiolytic use, unspecified with withdrawal, uncomplicated: Secondary | ICD-10-CM | POA: Diagnosis not present

## 2021-06-22 DIAGNOSIS — F1510 Other stimulant abuse, uncomplicated: Secondary | ICD-10-CM | POA: Diagnosis present

## 2021-06-22 DIAGNOSIS — F13239 Sedative, hypnotic or anxiolytic dependence with withdrawal, unspecified: Secondary | ICD-10-CM | POA: Insufficient documentation

## 2021-06-22 DIAGNOSIS — F1390 Sedative, hypnotic, or anxiolytic use, unspecified, uncomplicated: Secondary | ICD-10-CM

## 2021-06-22 DIAGNOSIS — Z20822 Contact with and (suspected) exposure to covid-19: Secondary | ICD-10-CM | POA: Insufficient documentation

## 2021-06-22 DIAGNOSIS — F17200 Nicotine dependence, unspecified, uncomplicated: Secondary | ICD-10-CM

## 2021-06-22 DIAGNOSIS — Z79899 Other long term (current) drug therapy: Secondary | ICD-10-CM | POA: Insufficient documentation

## 2021-06-22 DIAGNOSIS — F330 Major depressive disorder, recurrent, mild: Secondary | ICD-10-CM

## 2021-06-22 LAB — RESP PANEL BY RT-PCR (FLU A&B, COVID) ARPGX2
Influenza A by PCR: NEGATIVE
Influenza B by PCR: NEGATIVE
SARS Coronavirus 2 by RT PCR: NEGATIVE

## 2021-06-22 LAB — COMPREHENSIVE METABOLIC PANEL
ALT: 14 U/L (ref 0–44)
AST: 13 U/L — ABNORMAL LOW (ref 15–41)
Albumin: 5.2 g/dL — ABNORMAL HIGH (ref 3.5–5.0)
Alkaline Phosphatase: 39 U/L (ref 38–126)
Anion gap: 11 (ref 5–15)
BUN: 20 mg/dL (ref 6–20)
CO2: 24 mmol/L (ref 22–32)
Calcium: 10.1 mg/dL (ref 8.9–10.3)
Chloride: 108 mmol/L (ref 98–111)
Creatinine, Ser: 0.85 mg/dL (ref 0.61–1.24)
GFR, Estimated: >60 mL/min (ref >60)
Glucose, Bld: 106 mg/dL — ABNORMAL HIGH (ref 70–99)
Potassium: 4.1 mmol/L (ref 3.5–5.1)
Sodium: 143 mmol/L (ref 135–145)
Total Bilirubin: 0.9 mg/dL (ref 0.3–1.2)
Total Protein: 7.9 g/dL (ref 6.5–8.1)

## 2021-06-22 LAB — POCT URINE DRUG SCREEN - MANUAL ENTRY (I-SCREEN)
POC Amphetamine UR: NOT DETECTED
POC Buprenorphine (BUP): NOT DETECTED
POC Cocaine UR: NOT DETECTED
POC Marijuana UR: POSITIVE — AB
POC Methadone UR: NOT DETECTED
POC Methamphetamine UR: NOT DETECTED
POC Morphine: NOT DETECTED
POC Oxazepam (BZO): POSITIVE — AB
POC Oxycodone UR: NOT DETECTED
POC Secobarbital (BAR): NOT DETECTED

## 2021-06-22 LAB — URINALYSIS, ROUTINE W REFLEX MICROSCOPIC
Bilirubin Urine: NEGATIVE
Glucose, UA: NEGATIVE mg/dL
Hgb urine dipstick: NEGATIVE
Ketones, ur: NEGATIVE mg/dL
Leukocytes,Ua: NEGATIVE
Nitrite: NEGATIVE
Protein, ur: NEGATIVE mg/dL
Specific Gravity, Urine: 1.026 (ref 1.005–1.030)
pH: 7 (ref 5.0–8.0)

## 2021-06-22 LAB — CBC WITH DIFFERENTIAL/PLATELET

## 2021-06-22 LAB — LIPID PANEL
Cholesterol: 115 mg/dL (ref 0–200)
HDL: 37 mg/dL — ABNORMAL LOW (ref >40)
LDL Cholesterol: 59 mg/dL (ref 0–99)
Total CHOL/HDL Ratio: 3.1 RATIO
Triglycerides: 95 mg/dL (ref <150)
VLDL: 19 mg/dL (ref 0–40)

## 2021-06-22 LAB — POC SARS CORONAVIRUS 2 AG: SARSCOV2ONAVIRUS 2 AG: NEGATIVE

## 2021-06-22 LAB — TSH: TSH: 1.294 u[IU]/mL (ref 0.350–4.500)

## 2021-06-22 MED ORDER — LOPERAMIDE HCL 2 MG PO CAPS
2.0000 mg | ORAL_CAPSULE | ORAL | Status: DC | PRN
  Administered 2021-06-24: 4 mg via ORAL
  Filled 2021-06-22: qty 2

## 2021-06-22 MED ORDER — HYDROXYZINE HCL 25 MG PO TABS
25.0000 mg | ORAL_TABLET | Freq: Four times a day (QID) | ORAL | Status: DC | PRN
  Administered 2021-06-23 – 2021-06-25 (×4): 25 mg via ORAL
  Filled 2021-06-22 (×4): qty 1

## 2021-06-22 MED ORDER — CHLORDIAZEPOXIDE HCL 25 MG PO CAPS
25.0000 mg | ORAL_CAPSULE | Freq: Three times a day (TID) | ORAL | Status: AC
  Administered 2021-06-23 (×3): 25 mg via ORAL
  Filled 2021-06-22 (×3): qty 1

## 2021-06-22 MED ORDER — DICYCLOMINE HCL 10 MG PO CAPS
10.0000 mg | ORAL_CAPSULE | Freq: Four times a day (QID) | ORAL | Status: DC | PRN
  Administered 2021-06-24: 10 mg via ORAL
  Filled 2021-06-22: qty 1

## 2021-06-22 MED ORDER — DESVENLAFAXINE SUCCINATE ER 50 MG PO TB24
50.0000 mg | ORAL_TABLET | Freq: Every day | ORAL | Status: DC
  Administered 2021-06-23 – 2021-06-25 (×3): 50 mg via ORAL
  Filled 2021-06-22 (×3): qty 1

## 2021-06-22 MED ORDER — FENOFIBRATE 160 MG PO TABS
160.0000 mg | ORAL_TABLET | Freq: Every day | ORAL | Status: DC
  Administered 2021-06-22 – 2021-06-25 (×4): 160 mg via ORAL
  Filled 2021-06-22 (×4): qty 1

## 2021-06-22 MED ORDER — ONDANSETRON 4 MG PO TBDP: 4.0000 mg | ORAL_TABLET | Freq: Four times a day (QID) | ORAL | Status: DC | PRN

## 2021-06-22 MED ORDER — CHLORDIAZEPOXIDE HCL 25 MG PO CAPS
25.0000 mg | ORAL_CAPSULE | Freq: Every day | ORAL | Status: AC
  Administered 2021-06-25: 25 mg via ORAL
  Filled 2021-06-22: qty 1

## 2021-06-22 MED ORDER — CHLORDIAZEPOXIDE HCL 25 MG PO CAPS
25.0000 mg | ORAL_CAPSULE | Freq: Four times a day (QID) | ORAL | Status: AC
  Administered 2021-06-22 (×3): 25 mg via ORAL
  Filled 2021-06-22 (×3): qty 1

## 2021-06-22 MED ORDER — DESVENLAFAXINE SUCCINATE ER 50 MG PO TB24: 50.0000 mg | ORAL_TABLET | Freq: Every day | ORAL | Status: DC

## 2021-06-22 MED ORDER — NICOTINE 14 MG/24HR TD PT24: 14.0000 mg | MEDICATED_PATCH | Freq: Every day | TRANSDERMAL | Status: DC | PRN

## 2021-06-22 MED ORDER — ACETAMINOPHEN 325 MG PO TABS: 650.0000 mg | ORAL_TABLET | Freq: Four times a day (QID) | ORAL | Status: DC | PRN

## 2021-06-22 MED ORDER — FOLIC ACID 1 MG PO TABS
1.0000 mg | ORAL_TABLET | Freq: Every day | ORAL | Status: DC
  Administered 2021-06-22 – 2021-06-25 (×4): 1 mg via ORAL
  Filled 2021-06-22 (×4): qty 1

## 2021-06-22 MED ORDER — ADULT MULTIVITAMIN W/MINERALS CH
1.0000 | ORAL_TABLET | Freq: Every day | ORAL | Status: DC
  Administered 2021-06-22 – 2021-06-25 (×4): 1 via ORAL
  Filled 2021-06-22 (×4): qty 1

## 2021-06-22 MED ORDER — VENLAFAXINE HCL ER 75 MG PO CP24
75.0000 mg | ORAL_CAPSULE | Freq: Every day | ORAL | Status: DC
  Filled 2021-06-22: qty 1

## 2021-06-22 MED ORDER — ALUM & MAG HYDROXIDE-SIMETH 200-200-20 MG/5ML PO SUSP
30.0000 mL | ORAL | Status: DC | PRN
  Administered 2021-06-24: 30 mL via ORAL
  Filled 2021-06-22: qty 30

## 2021-06-22 MED ORDER — PANTOPRAZOLE SODIUM 40 MG PO TBEC
40.0000 mg | DELAYED_RELEASE_TABLET | Freq: Every day | ORAL | Status: DC
  Administered 2021-06-22 – 2021-06-25 (×4): 40 mg via ORAL
  Filled 2021-06-22 (×4): qty 1

## 2021-06-22 MED ORDER — ADULT MULTIVITAMIN W/MINERALS CH: 1.0000 | ORAL_TABLET | Freq: Every day | ORAL | Status: DC

## 2021-06-22 MED ORDER — ROSUVASTATIN CALCIUM 20 MG PO TABS
20.0000 mg | ORAL_TABLET | Freq: Every day | ORAL | Status: DC
  Administered 2021-06-22 – 2021-06-24 (×3): 20 mg via ORAL
  Filled 2021-06-22 (×3): qty 1

## 2021-06-22 MED ORDER — MAGNESIUM HYDROXIDE 400 MG/5ML PO SUSP: 30.0000 mL | Freq: Every day | ORAL | Status: DC | PRN

## 2021-06-22 MED ORDER — MIRTAZAPINE 15 MG PO TABS
15.0000 mg | ORAL_TABLET | Freq: Every day | ORAL | Status: DC
  Administered 2021-06-22 – 2021-06-24 (×3): 15 mg via ORAL
  Filled 2021-06-22 (×3): qty 1

## 2021-06-22 MED ORDER — CHLORDIAZEPOXIDE HCL 25 MG PO CAPS: 25.0000 mg | ORAL_CAPSULE | Freq: Four times a day (QID) | ORAL | Status: DC | PRN

## 2021-06-22 MED ORDER — PANCRELIPASE (LIP-PROT-AMYL) 36000-114000 UNITS PO CPEP
36000.0000 [IU] | ORAL_CAPSULE | Freq: Three times a day (TID) | ORAL | Status: DC
  Administered 2021-06-22 – 2021-06-25 (×8): 36000 [IU] via ORAL
  Filled 2021-06-22 (×7): qty 1

## 2021-06-22 MED ORDER — SUCRALFATE 1 G PO TABS
1.0000 g | ORAL_TABLET | Freq: Two times a day (BID) | ORAL | Status: DC
  Administered 2021-06-22 – 2021-06-25 (×7): 1 g via ORAL
  Filled 2021-06-22 (×7): qty 1

## 2021-06-22 MED ORDER — THIAMINE HCL 100 MG/ML IJ SOLN
100.0000 mg | Freq: Once | INTRAMUSCULAR | Status: AC
  Administered 2021-06-22: 100 mg via INTRAMUSCULAR
  Filled 2021-06-22: qty 2

## 2021-06-22 MED ORDER — THIAMINE HCL 100 MG PO TABS
100.0000 mg | ORAL_TABLET | Freq: Every day | ORAL | Status: DC
  Administered 2021-06-23 – 2021-06-25 (×3): 100 mg via ORAL
  Filled 2021-06-22 (×3): qty 1

## 2021-06-22 MED ORDER — CHLORDIAZEPOXIDE HCL 25 MG PO CAPS
25.0000 mg | ORAL_CAPSULE | ORAL | Status: AC
  Administered 2021-06-24 (×2): 25 mg via ORAL
  Filled 2021-06-22 (×2): qty 1

## 2021-06-22 NOTE — ED Notes (Signed)
Pt just arrived on the unit

## 2021-06-22 NOTE — ED Notes (Signed)
Pt admitted to [facility] Observation unit while awaiting PCR covid test. Patient is very pleasant and cooperative. He reports difficulty with substance use using xanax ''off the street '' to cope with my anxiety. I just feel really anxious, panicky right now. '' Patient denies any SI or HI and no signs of acute psychosis noted. Pt father supportive per pt report. Pt also reports hx of pancreatic insufficiency and requires Creon with meals. Patient oriented to unit. Skin searched and no contraband found. Pt blood and covid tests drawn. EKG completed. Pt is safe, will con't to monitor.

## 2021-06-22 NOTE — Discharge Instructions (Addendum)
To see which pharmacy near you is the CHEAPEST for certain medications, please use GoodRx. It is free website and has a free phone app.      Also consider looking at [facility] $4.00 or [facility] $7.00 prescription list. Both are free to view if googled [facility] prescription" and "[facility] $7 prescription". These are set prices, no insurance required.  For issues with sleep, please use this free app for insomnia called CBT-I. Let your doctors and therapist know so they can help with extra tips and tricks or for guidance and accountability. NO ADDS on the app.       Please make regular appointments with an outpatient psychiatrist and other doctors once you leave the hospital.    Suicide hotline: 988 Emergency: 911    Based on your needs and what you have requested, several resources have been provided for outpatient therapy.  It is imperative that you follow up on treatment no later than 5-7 days from the day of discharge to mitigate risk to your safety and mental health.  In case of an urgent emergency, you have the option of contacting the Mobile Crisis Unit with [facility] at [phone].    Outpatient psychiatry, therapy and CD-IOP (morning and afternoon).  Accepts Medicaid.        [facility]      [address] [place], [place] [address]      [phone] phone       [facility]      [address], [address]      [place], [place] [address]      [phone]       [facility]      [address]      [place], [place] [address]      [phone]   [facility] [address] [place] [address], [address] [place], [place], [address] [phone] phone  [facility] - also offers CD-IOP [address], [address] [place], [place], [address] [phone] phone  [facility] [address] [place], [place], [address] [phone] phone [phone]  fax  [facility] [address] [place], [place], [address]  [phone] phone [phone] fax  [facility] [address], [place], [address] [phone] phone

## 2021-06-22 NOTE — ED Notes (Signed)
Pt is in the bed sleeping. Respirations are even and unlabored. No acute distress noted. Will continue to monitor for safety. 

## 2021-06-22 NOTE — ED Notes (Signed)
Pt admitted to [facility]. Patient A&Ox4. Patient is very pleasant and cooperative but also tearful at times.Marland Kitchen He reports difficulty with substance use using xanax ''off the street '' to cope with my anxiety. Patient reports increase anxiety at this. Patient denies any SI or HI and no signs of acute psychosis noted. Denies A/VH. Pt also reports hx of pancreatic insufficiency and requires Creon with meals.  Creon was given with meal.Patient oriented to unit. Skin searched and no contraband found.Patient No acute distress noted. Support and encouragement provided. Routine safety checks conducted according to facility protocol. Encouraged patient to notify staff if thoughts of harm toward self or others arise. Patient verbalize understanding and agreement. Will continue to monitor for safety.
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
## 2021-06-22 NOTE — ED Provider Notes (Signed)
Facility Based Crisis Admission H&P  Date: 06/22/21 Patient Name: Jeffery Morales MRN: [id number] Chief Complaint: No chief complaint on file.     Diagnoses:  Final diagnoses:  Benzodiazepine misuse    HPI: Jeffery Junhomas Tolson is a 26 y.o. male h/o MDD, GAD, ADHD, cannabis use, benzo misuse, stimulant misuse, [facility] dad for benzo detox.  4 days prior, he presented to [facility] for the same complaint, but was discharged without any benzos.  Dad was present during the evaluation, patient consented.  Patient stated that today he attempted to go to his outpatient provider for prescription of Xanax, however the provider declined and instructed that patient come to [facility].  Patient stated that he ran out of Xanax a few days ago for which he presented to [facility] ED. patient was discharged, without benzos, for which the patient got Xanax from his friends.  Patient reported that he has Klonopin at home, but he continues to perseverate on Xanax.  He reported that he has been on Klonopin since he was 17.  However he has "turned the corner" and wants to quit using benzos.  He denied ever having seizures or symptoms of AVH from withdrawing. However patient does report vague shadows in the corner of his eyes intermittently, but has been there on and off for a while.  However patient reported that he realized that is not real and pays it no mind.   Patient also has a history of ADHD with prescription Concerta.  Patient reported that he has stopped using any prescription stimulants months ago, as he feels like they exacerbate his withdrawal and anxiety.  However.  Patient is the patient has been feeling Concerta 18 mg a 28-day supply monthly, with last refill on 06/14/2021.  Patient also has been feeling Klonopin 0.5 mg 3 times daily #90, last filled 05/26/2021.  Discussed [facility] admission, patient was amenable.  Today patient denied SI/HI/AVH, delusions, paranoia.  He is alert and  oriented x4.  Reported that his sleep is poor, and his appetite is improving, although still low, mainly due to his IBS.  Past psychiatric history:  Dx: MDD, ADHD, GAD, benzos misuse, cannabis use, prescription stimulant misuse, tobacco use Rx: Pristiq 50 mg daily, Remeron 15 mg nightly [facility] No outpatient therapy Psych hospitalization-never History of suicide-never  Past medical history: Dx: Chronic pancreatic insufficiency since childhood Rx: [facility] Patient follows [facility]CP regularly  Social history Patient currently lives with family  Substance use history Reported tobacco use, cannabis use, prescription stimulants misuse, prescription benzodiazepine misuse. Patient denied alcohol use, IVDU, illicit drugs Patient denied detox or rehab in the past  Family history: Patient denied family history of suicide, inpatient hospitalization, bipolar disorder, schizophrenia/schizoaffective disorder. Grandfather had history of alcohol abuse.  Father used to be on benzodiazepine chronically Report that both parents, and siblings have depression and anxiety and are currently on medications for it.  PHQ 2-9:   Flowsheet Row [facility]  C-SSRS RISK CATEGORY No Risk        Total Time spent with patient: 30 minutes  Musculoskeletal  Strength & Muscle Tone: within normal limits Gait & Station: normal Patient leans: N/A  Psychiatric Specialty Exam  Presentation General Appearance: Appropriate for Environment; Casual; Fairly Groomed (Currently withdrawing- tremors, diaphoresis) Eye Contact:Good Speech:Clear and Coherent (Nonpressured) Speech Volume:Increased Handedness:Right  Mood and Affect  Mood:Anxious Affect:Congruent; Full Range  Thought Process  Thought Processes:Coherent; Linear; Goal  Directed Descriptions of Associations:Intact  Orientation:Full (Time, Place and Person)  Thought  Content:WDL; Logical  Diagnosis of Schizophrenia or Schizoaffective disorder in past: No  Duration of Psychotic Symptoms: N/A  Hallucinations:Hallucinations: None (Does have delusional vague and transient shadows at the corner of the eyes intermittently.  Patient is unconcerned.  Possibly a floater.)  Ideas of Reference:None  Suicidal Thoughts:Suicidal Thoughts: No (Contracted to safety)  Homicidal Thoughts:Homicidal Thoughts: No   Sensorium  Memory:Immediate Good; Recent Fair Judgment:Fair Insight:Fair  Executive Functions  Concentration:Good Attention Span:Good Recall:Good Fund of Knowledge:Good Language:Good  Psychomotor Activity  Psychomotor Activity:Psychomotor Activity: Tremor; Restlessness  Assets  Assets:Communication Skills; Desire for Improvement; Financial Resources/Insurance; Housing; Resilience; Social Support  Sleep  Sleep:Sleep: Fair  Nutritional Assessment (For OBS and [facility] admissions only) Has the patient had a weight loss or gain of 10 pounds or more in the last 3 months?: No Has the patient had a decrease in food intake/or appetite?: No Does the patient have dental problems?: No Does the patient have eating habits or behaviors that may be indicators of an eating disorder including binging or inducing vomiting?: No    Physical Exam Vitals and nursing note reviewed.  Constitutional:      Appearance: He is diaphoretic.  HENT:     Head: Normocephalic and atraumatic.  Pulmonary:     Effort: Pulmonary effort is normal. No respiratory distress.  Neurological:     General: No focal deficit present.     Mental Status: He is alert and oriented to person, place, and time.    Review of Systems  Respiratory:  Negative for cough and shortness of breath.   Cardiovascular:  Negative for chest pain.  Gastrointestinal:  Negative for abdominal pain, nausea and vomiting.  Neurological:  Positive for tremors. Negative for dizziness, seizures, weakness and  headaches.    Blood pressure 127/88, pulse 77, temperature 99.1 F (37.3 C), temperature source Oral, resp. rate 18, SpO2 98 %. There is no height or weight on file to calculate BMI.  Past Psychiatric History: Per above  Is the patient at risk to self? No  Has the patient been a risk to self in the past 6 months? No .    Has the patient been a risk to self within the distant past? No   Is the patient a risk to others? No   Has the patient been a risk to others in the past 6 months? No   Has the patient been a risk to others within the distant past? No   Past Medical History:  Past Medical History:  Diagnosis Date   Acne    ADHD (attention deficit hyperactivity disorder)    ADD   Asthma    hx of asthma, has albuterol that he uses w/ assoc bronchitis   At risk for impaired digestion    has an appt to see gastroenterologist   Hematuria    w/ dysuria   Seasonal allergies     Past Surgical History:  Procedure Laterality Date   ADENOIDECTOMY     CYSTOSCOPY WITH URETHRAL DILATATION N/A 02/27/2012   Procedure: CYSTOSCOPY WITH URETHRAL DILATATION;  Surgeon: Valetta Fuller, MD;  Location: [facility];  Service: Urology;  Laterality: N/A;  30 MIN  URETHRAL DILATION    MYRINGOTOMY WITH TUBE PLACEMENT     TONSILLECTOMY      Family History:  Family History  Problem Relation Age of Onset   Hypertension Mother    Dementia Sister  Alcohol abuse Sister    Alcohol abuse Paternal Uncle    Heart disease Maternal Grandfather    Hyperlipidemia Maternal Grandfather    Diabetes Maternal Grandfather    Heart disease Maternal Grandmother    Hyperlipidemia Maternal Grandmother    Diabetes Maternal Grandmother    Alcohol abuse Paternal Grandfather     Social History:  Social History   Socioeconomic History   Marital status: Single    Spouse name: Not on file   Number of children: Not on file   Years of education: Not on file   Highest education level: Not on file   Occupational History   Not on file  Tobacco Use   Smoking status: Never   Smokeless tobacco: Never  Substance and Sexual Activity   Alcohol use: Yes    Comment: per mom   Drug use: Not Currently   Sexual activity: Not Currently    Comment: per mom  Other Topics Concern   Not on file  Social History Narrative   Not on file   Social Determinants of Health   Financial Resource Strain: Not on file  Food Insecurity: Not on file  Transportation Needs: Not on file  Physical Activity: Not on file  Stress: Not on file  Social Connections: Not on file  Intimate Partner Violence: Not on file    SDOH:  SDOH Screenings   Alcohol Screen: Not on file  Depression (PHQ2-9): Low Risk  (09/28/2020)   Depression (PHQ2-9)    PHQ-2 Score: 1  Financial Resource Strain: Not on file  Food Insecurity: Not on file  Housing: Not on file  Physical Activity: Not on file  Social Connections: Not on file  Stress: Not on file  Tobacco Use: Low Risk  (06/22/2021)   Patient History    Smoking Tobacco Use: Never    Smokeless Tobacco Use: Never    Passive Exposure: Not on file  Transportation Needs: Not on file    Last Labs:  Admission on 06/22/2021  Component Date Value Ref Range Status   SARS Coronavirus 2 by RT PCR 06/22/2021 NEGATIVE  NEGATIVE Final   Comment: (NOTE) SARS-CoV-2 target nucleic acids are NOT DETECTED.  The SARS-CoV-2 RNA is generally detectable in upper respiratory specimens during the acute phase of infection. The lowest concentration of SARS-CoV-2 viral copies this assay can detect is 138 copies/mL. A negative result does not preclude SARS-Cov-2 infection and should not be used as the sole basis for treatment or other patient management decisions. A negative result may occur with  improper specimen collection/handling, submission of specimen other than nasopharyngeal swab, presence of viral mutation(s) within the areas targeted by this assay, and inadequate number of  viral copies(<138 copies/mL). A negative result must be combined with clinical observations, patient history, and epidemiological information. The expected result is Negative.  Fact Sheet for Patients:  BloggerCourse.com  Fact Sheet for Healthcare Providers:  SeriousBroker.it  This test is no                          t yet approved or cleared by the [place] FDA and  has been authorized for detection and/or diagnosis of SARS-CoV-2 by FDA under an Emergency Use Authorization (EUA). This EUA will remain  in effect (meaning this test can be used) for the duration of the COVID-19 declaration under Section 564(b)(1) of the Act, 21 U.S.C.section 360bbb-3(b)(1), unless the authorization is terminated  or revoked sooner.  Influenza A by PCR 06/22/2021 NEGATIVE  NEGATIVE Final   Influenza B by PCR 06/22/2021 NEGATIVE  NEGATIVE Final   Comment: (NOTE) The Xpert Xpress SARS-CoV-2/FLU/RSV plus assay is intended as an aid in the diagnosis of influenza from Nasopharyngeal swab specimens and should not be used as a sole basis for treatment. Nasal washings and aspirates are unacceptable for Xpert Xpress SARS-CoV-2/FLU/RSV testing.  Fact Sheet for Patients: BloggerCourse.com  Fact Sheet for Healthcare Providers: SeriousBroker.it  This test is not yet approved or cleared by the [place] FDA and has been authorized for detection and/or diagnosis of SARS-CoV-2 by FDA under an Emergency Use Authorization (EUA). This EUA will remain in effect (meaning this test can be used) for the duration of the COVID-19 declaration under Section 564(b)(1) of the Act, 21 U.S.C. section 360bbb-3(b)(1), unless the authorization is terminated or revoked.  Performed at [facility], [address], [place], [place] [address]    WBC 06/22/2021 TEST REQUEST RECEIVED WITHOUT APPROPRIATE SPECIMEN   4.0 - 10.5 K/uL Final   RBC 06/22/2021 TEST REQUEST RECEIVED WITHOUT APPROPRIATE SPECIMEN  4.22 - 5.81 MIL/uL Final   Hemoglobin 06/22/2021 TEST REQUEST RECEIVED WITHOUT APPROPRIATE SPECIMEN  13.0 - 17.0 g/dL Final   HCT 35/46/5681 TEST REQUEST RECEIVED WITHOUT APPROPRIATE SPECIMEN  39.0 - 52.0 % Final   MCV 06/22/2021 TEST REQUEST RECEIVED WITHOUT APPROPRIATE SPECIMEN  80.0 - 100.0 fL Final   [facility] 06/22/2021 TEST REQUEST RECEIVED WITHOUT APPROPRIATE SPECIMEN  26.0 - 34.0 pg Final   MCHC 06/22/2021 TEST REQUEST RECEIVED WITHOUT APPROPRIATE SPECIMEN  30.0 - 36.0 g/dL Final   RDW 27/51/7001 TEST REQUEST RECEIVED WITHOUT APPROPRIATE SPECIMEN  11.5 - 15.5 % Final   Platelets 06/22/2021 TEST REQUEST RECEIVED WITHOUT APPROPRIATE SPECIMEN  150 - 400 K/uL Final   nRBC 06/22/2021 TEST REQUEST RECEIVED WITHOUT APPROPRIATE SPECIMEN  0.0 - 0.2 % Final   Neutrophils Relative % 06/22/2021 PENDING  % Incomplete   Neutro Abs 06/22/2021 PENDING  1.7 - 7.7 K/uL Incomplete   Band Neutrophils 06/22/2021 PENDING  % Incomplete   Lymphocytes Relative 06/22/2021 PENDING  % Incomplete   Lymphs Abs 06/22/2021 PENDING  0.7 - 4.0 K/uL Incomplete   Monocytes Relative 06/22/2021 PENDING  % Incomplete   Monocytes Absolute 06/22/2021 PENDING  0.1 - 1.0 K/uL Incomplete   Eosinophils Relative 06/22/2021 PENDING  % Incomplete   Eosinophils Absolute 06/22/2021 PENDING  0.0 - 0.5 K/uL Incomplete   Basophils Relative 06/22/2021 PENDING  % Incomplete   Basophils Absolute 06/22/2021 PENDING  0.0 - 0.1 K/uL Incomplete   WBC Morphology 06/22/2021 PENDING   Incomplete   RBC Morphology 06/22/2021 PENDING   Incomplete   Smear Review 06/22/2021 PENDING   Incomplete   Other 06/22/2021 PENDING  % Incomplete   nRBC 06/22/2021 PENDING  0 /100 WBC Incomplete   Metamyelocytes Relative 06/22/2021 PENDING  % Incomplete   Myelocytes 06/22/2021 PENDING  % Incomplete   Promyelocytes Relative 06/22/2021 PENDING  % Incomplete   Blasts 06/22/2021  PENDING  % Incomplete   Immature Granulocytes 06/22/2021 PENDING  % Incomplete   Abs Immature Granulocytes 06/22/2021 PENDING  0.00 - 0.07 K/uL Incomplete   Sodium 06/22/2021 143  135 - 145 mmol/L Final   Potassium 06/22/2021 4.1  3.5 - 5.1 mmol/L Final   Chloride 06/22/2021 108  98 - 111 mmol/L Final   CO2 06/22/2021 24  22 - 32 mmol/L Final   Glucose, Bld 06/22/2021 106 (H)  70 - 99 mg/dL Final   Glucose  reference range applies only to samples taken after fasting for at least 8 hours.   BUN 06/22/2021 20  6 - 20 mg/dL Final   Creatinine, Ser 06/22/2021 0.85  0.61 - 1.24 mg/dL Final   Calcium 03/54/6568 10.1  8.9 - 10.3 mg/dL Final   Total Protein 12/75/1700 7.9  6.5 - 8.1 g/dL Final   Albumin 17/49/4496 5.2 (H)  3.5 - 5.0 g/dL Final   AST 75/91/6384 13 (L)  15 - 41 U/L Final   ALT 06/22/2021 14  0 - 44 U/L Final   Alkaline Phosphatase 06/22/2021 39  38 - 126 U/L Final   Total Bilirubin 06/22/2021 0.9  0.3 - 1.2 mg/dL Final   GFR, Estimated 06/22/2021 >60  >60 mL/min Final   Comment: (NOTE) Calculated using the CKD-EPI Creatinine Equation (2021)    Anion gap 06/22/2021 11  5 - 15 Final   Performed at [facility], [address], [place], [place] [address]   TSH 06/22/2021 1.294  0.350 - 4.500 uIU/mL Final   Comment: Performed by a 3rd Generation assay with a functional sensitivity of <=0.01 uIU/mL. Performed at [facility], [address], [place], [place] [address]    Color, Urine 06/22/2021 AMBER (A)  YELLOW Final   BIOCHEMICALS MAY BE AFFECTED BY COLOR   APPearance 06/22/2021 CLOUDY (A)  CLEAR Final   Specific Gravity, Urine 06/22/2021 1.026  1.005 - 1.030 Final   pH 06/22/2021 7.0  5.0 - 8.0 Final   Glucose, UA 06/22/2021 NEGATIVE  NEGATIVE mg/dL Final   Hgb urine dipstick 06/22/2021 NEGATIVE  NEGATIVE Final   Bilirubin Urine 06/22/2021 NEGATIVE  NEGATIVE Final   Ketones, ur 06/22/2021 NEGATIVE  NEGATIVE mg/dL Final   Protein, ur 77/93/9030 NEGATIVE  NEGATIVE  mg/dL Final   Nitrite 09/25/3005 NEGATIVE  NEGATIVE Final   Leukocytes,Ua 06/22/2021 NEGATIVE  NEGATIVE Final   Performed at [facility], [address], [place], [place] [address]   POC Amphetamine UR 06/22/2021 None Detected  NONE DETECTED (Cut Off Level 1000 ng/mL) Final   POC Secobarbital (BAR) 06/22/2021 None Detected  NONE DETECTED (Cut Off Level 300 ng/mL) Final   POC Buprenorphine (BUP) 06/22/2021 None Detected  NONE DETECTED (Cut Off Level 10 ng/mL) Final   POC Oxazepam (BZO) 06/22/2021 Positive (A)  NONE DETECTED (Cut Off Level 300 ng/mL) Final   POC Cocaine UR 06/22/2021 None Detected  NONE DETECTED (Cut Off Level 300 ng/mL) Final   POC Methamphetamine UR 06/22/2021 None Detected  NONE DETECTED (Cut Off Level 1000 ng/mL) Final   POC Morphine 06/22/2021 None Detected  NONE DETECTED (Cut Off Level 300 ng/mL) Final   POC Methadone UR 06/22/2021 None Detected  NONE DETECTED (Cut Off Level 300 ng/mL) Final   POC Oxycodone UR 06/22/2021 None Detected  NONE DETECTED (Cut Off Level 100 ng/mL) Final   POC Marijuana UR 06/22/2021 Positive (A)  NONE DETECTED (Cut Off Level 50 ng/mL) Final   SARSCOV2ONAVIRUS 2 AG 06/22/2021 NEGATIVE  NEGATIVE Final   Comment: (NOTE) SARS-CoV-2 antigen NOT DETECTED.   Negative results are presumptive.  Negative results do not preclude SARS-CoV-2 infection and should not be used as the sole basis for treatment or other patient management decisions, including infection  control decisions, particularly in the presence of clinical signs and  symptoms consistent with COVID-19, or in those who have been in contact with the virus.  Negative results must be combined with clinical observations, patient history, and epidemiological information. The expected  result is Negative.  Fact Sheet for Patients: https://www.jennings-kim.com/  Fact Sheet for Healthcare Providers: https://alexander-rogers.biz/  This test is not yet approved or  cleared by the [place] FDA and  has been authorized for detection and/or diagnosis of SARS-CoV-2 by FDA under an Emergency Use Authorization (EUA).  This EUA will remain in effect (meaning this test can be used) for the duration of  the COV                          ID-19 declaration under Section 564(b)(1) of the Act, 21 U.S.C. section 360bbb-3(b)(1), unless the authorization is terminated or revoked sooner.     Cholesterol 06/22/2021 115  0 - 200 mg/dL Final   Triglycerides 78/29/5621 95  <150 mg/dL Final   HDL 30/86/5784 37 (L)  >40 mg/dL Final   Total CHOL/HDL Ratio 06/22/2021 3.1  RATIO Final   VLDL 06/22/2021 19  0 - 40 mg/dL Final   LDL Cholesterol 06/22/2021 59  0 - 99 mg/dL Final   Comment:        Total Cholesterol/HDL:CHD Risk Coronary Heart Disease Risk Table                     Men   Women  1/2 Average Risk   3.4   3.3  Average Risk       5.0   4.4  2 X Average Risk   9.6   7.1  3 X Average Risk  23.4   11.0        Use the calculated Patient Ratio above and the CHD Risk Table to determine the patient's CHD Risk.        ATP III CLASSIFICATION (LDL):  <100     mg/dL   Optimal  696-295  mg/dL   Near or Above                    Optimal  130-159  mg/dL   Borderline  284-132  mg/dL   High  >440     mg/dL   Very High Performed at [facility], [address], [place], [place] [address]     Allergies: Patient has no known allergies.  PTA Medications: (Not in a hospital admission)   Long Term Goals: Improvement in symptoms so as ready for discharge  Short Term Goals: Patient will verbalize feelings in meetings with treatment team members., Patient will attend at least of 50% of the groups daily., Pt will complete the PHQ9 on admission, day 3 and discharge., Patient will participate in completing the [place] Suicide Severity Rating Scale q shift., Patient will remain free of seclusion/restraint/assault incidents 24 hours prior to discharge., and Patient  will take medications as prescribed daily.  Medical Decision Making  Patient [facility] for benzodiazepine detox.  Patient was started on Librium taper.  LFTs within normal limits.   Will also be restarting patient's home medications.   Benzodiazepine withdrawal Patient does not have a history of seizures or DT. Started Librium taper CIWA per protocol Thiamine supplement Encouraged cessation Plan to discuss with CSW for resources for substance use  MDD, GAD-stable Currently patient is on Pristiq 50 mg daily, Remeron 15 mg qHS.  Patient denied SI/HI/AVH. Continued home Pristiq and Remeron per above.  Chronic pancreatic insufficiency since childhood. Restart patient on home medications.  Fenofibrate 160 mg daily Folate and multivitamin supplement Lipase enzyme Pantoprazole 40 mg daily  Rosuvastatin 20 mg qHS Sulfate 1 g twice daily  Substance use disorder Prescription stimulant misuse, cannabis use, tobacco use, benzo per above Home Concerta was discontinued Encourage cessation NRT PRN  IBS Continued home Bentyl as needed  Recommendations  Based on my evaluation the patient does not appear to have an emergency medical condition.  Princess Bruins, DO 06/22/21  6:01 PM

## 2021-06-22 NOTE — BH Assessment (Signed)
Comprehensive Clinical Assessment (CCA) Note  06/22/2021 Jeffery Morales [id number]  Chief Complaint: 26 year old report to [facility] due to experiencing withdrawal symptoms from xanax.  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 Self  What Is the Reason for Your Visit/Call Today? 26 year old report he's here for detox. Report he's taking xanax from the street at least 4mg  (oral) and he prescribed clonazepam report taking as prescribed. Patient stated he recently went out of town (3-weeks ago) and did not take xanax and he felt he was going to die. Patient stated he has been attempting to stopped using by decreasing usage and he ended up in the hospital 4 days due to high blood pressure. Report he was told by doctors self detoxing off xanax is dangerous and he needs to be in a place where he can be monitored. 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 Denied suicidal/homicidal ideations and denied auditory hallucinations. Report visual hallucinations. Patient is visiable shaking, reporting inability to think straight, shaking voice, and fidgetty. Patient expressed he was hiding his abuse but has come clean to his family and is requesting help.  How Long Has This Been Causing You Problems? 1 wk - 1 month  What Do You Feel Would Help You the Most Today? Alcohol or Drug Use Treatment   Have You Recently Had Any Thoughts About Hurting Yourself? No  Are You Planning to Commit Suicide/Harm Yourself At This time? No   Have you Recently Had Thoughts About Hurting Someone ? No  Are You Planning to Harm Someone at This Time? No  Explanation: No data recorded  Have You Used Any Alcohol or Drugs in the Past 24 Hours? Yes  How Long Ago Did You Use Drugs or Alcohol? No data recorded What Did You Use and How Much? 4mg    Do You Currently Have  a Therapist/Psychiatrist? No data recorded Name of Therapist/Psychiatrist: No data recorded  Have You Been Recently Discharged From Any Office Practice or Programs? No data recorded Explanation of Discharge From Practice/Program: No data recorded    CCA Screening Triage Referral Assessment Type of Contact: No data recorded Telemedicine Service Delivery:   Is this Initial or Reassessment? No data recorded Date Telepsych consult ordered in [facility]:  No data recorded Time Telepsych consult ordered in [facility]:  No data recorded Location of Assessment: No data recorded Provider Location: No data recorded  Collateral Involvement: No data recorded  Does Patient Have a Court Appointed Legal Guardian? No data recorded Name and Contact of Legal Guardian: No data recorded If Minor and Not Living with Parent(s), Who has Custody? No data recorded Is CPS involved or ever been involved? No data recorded Is APS involved or ever been involved? No data recorded  Patient Determined To Be At Risk for Harm To Self or Others Based on Review of Patient Reported Information or Presenting Complaint? No data recorded Method: No data recorded Availability of Means: No data recorded Intent: No data recorded Notification Required: No data recorded Additional Information for Danger to Others Potential: No data recorded Additional Comments for Danger to Others Potential: No data recorded Are There Guns or Other Weapons in Your Home? No data recorded Types of Guns/Weapons: No data recorded Are These Weapons Safely Secured?  No data recorded Who Could Verify You Are Able To Have These Secured: No data recorded Do You Have any Outstanding Charges, Pending Court Dates, Parole/Probation? No data recorded Contacted To Inform of Risk of Harm To Self or Others: No data recorded   Does Patient Present under Involuntary Commitment? No data recorded IVC Papers Initial File Date: No data  recorded  [place] of Residence: No data recorded  Patient Currently Receiving the Following Services: No data recorded  Determination of Need: Urgent (48 hours)   Options For Referral: Inpatient Hospitalization     CCA Biopsychosocial Patient Reported Schizophrenia/Schizoaffective Diagnosis in Past: No   Strengths: "I am kind" "I care about people"   Mental Health Symptoms Depression:   Difficulty Concentrating; Hopelessness; Increase/decrease in appetite; Irritability; Sleep (too much or little); Worthlessness (Report history of depression, diagnosed at 26 year old)   Duration of Depressive symptoms:  Duration of Depressive Symptoms: Greater than two weeks   Mania:   None   Anxiety:    Difficulty concentrating; Irritability; Restlessness; Sleep; Worrying; Tension   Psychosis:   Hallucinations (slight visual hallucinations)   Duration of Psychotic symptoms:  Duration of Psychotic Symptoms: Less than six months   Trauma:   N/A   Obsessions:   N/A   Compulsions:   N/A   Inattention:   N/A   Hyperactivity/Impulsivity:   N/A   Oppositional/Defiant Behaviors:   N/A   Emotional Irregularity:   N/A   Other Mood/Personality Symptoms:   none reported    Mental Status Exam Appearance and self-care  Stature:   Average   Weight:   Average weight   Clothing:   Casual   Grooming:   Normal   Cosmetic use:   None   Posture/gait:   Normal   Motor activity:   Agitated   Sensorium  Attention:   Distractible   Concentration:   Anxiety interferes   Orientation:   Object; Person; Place; Situation; Time; X5   Recall/memory:   Normal   Affect and Mood  Affect:   Appropriate   Mood:   Hopeless; Depressed; Anxious   Relating  Eye contact:   Fleeting   Facial expression:   Tense   Attitude toward examiner:   Cooperative   Thought and Language  Speech flow:  Other (Comment) (shaking)   Thought content:   Appropriate to Mood  and Circumstances   Preoccupation:   Other (Comment) (currently experiencing withdrawals)   Hallucinations:   Visual   Organization:  No data recorded  [employer] of Knowledge:   Average   Intelligence:   Average   Abstraction:   Normal   Judgement:   Fair   Art therapist:   Realistic   Insight:   Fair   Decision Making:   Normal   Social Functioning  Social Maturity:   Responsible   Social Judgement:   Normal   Stress  Stressors:   Other (Comment) (xanax abuse)   Coping Ability:   Normal   Skill Deficits:   None   Supports:   Family     Religion: Religion/Spirituality Are You A Religious Person?: Yes What is Your Religious Affiliation?: Personal assistant: Leisure / Recreation Do You Have Hobbies?: Yes Leisure and Hobbies: video, and play guitar, walk with dogs  Exercise/Diet: Exercise/Diet Do You Exercise?: Yes What Type of Exercise Do You Do?: Run/Walk How Many Times a Week Do You Exercise?: 1-3 times a week Have You Gained or Lost A Significant Amount of  Weight in the Past Six Months?: Yes-Lost Number of Pounds Lost?: 10 Do You Follow a Special Diet?: No Do You Have Any Trouble Sleeping?: Yes Explanation of Sleeping Difficulties: report sleeping to little (2-3 hours per night)   CCA Employment/Education Employment/Work Situation: Employment / Work Situation Employment Situation: Employed Work Stressors: n/a Patient's Job has Been Impacted by Current Illness: Yes Describe how Patient's Job has Been Impacted: How to tell boss of his xanax abuse Has Patient ever Been in the Military?: No  Education: Education Is Patient Currently Attending School?: No   CCA Family/Childhood History Family and Relationship History: Family history Marital status: Single Does patient have children?: No  Childhood History:  Childhood History By whom was/is the patient raised?: Both parents Did patient suffer any  verbal/emotional/physical/sexual abuse as a child?: No Did patient suffer from severe childhood neglect?: No Has patient ever been sexually abused/assaulted/raped as an adolescent or adult?: No Was the patient ever a victim of a crime or a disaster?: No Witnessed domestic violence?: No Has patient been affected by domestic violence as an adult?: No  Child/Adolescent Assessment:     CCA Substance Use Alcohol/Drug Use: Alcohol / Drug Use Pain Medications: see MAR Prescriptions: see MAR Over the Counter: see MAR History of alcohol / drug use?: Yes Substance #1 Name of Substance 1: xanax 1 - Age of First Use: 26 1 - Amount (size/oz): 4mg  1 - Frequency: daily 1 - Duration: ongoing 1 - Last Use / Amount: 06/15/2021 1 - Method of Aquiring: buying off street 1- Route of Use: oral                       ASAM's:  Six Dimensions of Multidimensional Assessment  Dimension 1:  Acute Intoxication and/or Withdrawal Potential:      Dimension 2:  Biomedical Conditions and Complications:      Dimension 3:  Emotional, Behavioral, or Cognitive Conditions and Complications:     Dimension 4:  Readiness to Change:     Dimension 5:  Relapse, Continued use, or Continued Problem Potential:     Dimension 6:  Recovery/Living Environment:     ASAM Severity Score:    ASAM Recommended Level of Treatment:     Substance use Disorder (SUD) Substance Use Disorder (SUD)  Checklist Symptoms of Substance Use: Continued use despite having a persistent/recurrent physical/psychological problem caused/exacerbated by use, Evidence of withdrawal (Comment), Substance(s) often taken in larger amounts or over longer times than was intended, Evidence of tolerance, Presence of craving or strong urge to use  Recommendations for Services/Supports/Treatments: Recommendations for Services/Supports/Treatments Recommendations For Services/Supports/Treatments: Individual Therapy, SAIOP (Substance Abuse Intensive  Outpatient Program)  Discharge Disposition:    DSM5 Diagnoses: Patient Active Problem List   Diagnosis Date Noted   Gross hematuria 02/27/2012     Referrals to Alternative Service(s): Referred to Alternative Service(s):   Place:   Date:   Time:    Referred to Alternative Service(s):   Place:   Date:   Time:    Referred to Alternative Service(s):   Place:   Date:   Time:    Referred to Alternative Service(s):   Place:   Date:   Time:     Marijane Trower, LCAS

## 2021-06-23 MED ORDER — NICOTINE 21 MG/24HR TD PT24
21.0000 mg | MEDICATED_PATCH | Freq: Every day | TRANSDERMAL | Status: DC
  Administered 2021-06-23 – 2021-06-25 (×3): 21 mg via TRANSDERMAL
  Filled 2021-06-23 (×3): qty 1

## 2021-06-23 NOTE — ED Notes (Signed)
Patient resting well with no sxs of discomfort - will continue to monitor for safety

## 2021-06-23 NOTE — ED Notes (Signed)
Patient denies SI,HI, AVH - no signs of discomfort noted - will continue to monitor for safety

## 2021-06-23 NOTE — ED Notes (Signed)
Pt is in the bed sleeping. Respirations are even and unlabored. No acute distress noted. Will continue to monitor for safety. 

## 2021-06-23 NOTE — ED Provider Notes (Signed)
Behavioral Health Progress Note  Date and Time: 06/23/2021 12:42 PM Name: Jeffery Morales MRN:  [id number]  Subjective:  Jeffery Morales 26 y.o., male patient who initially presented to [facility] as a recommendation from his provider due to Xanax use.  He was admitted to the [facility] unit for crisis stabilization.  Jeffery Morales, 26 y.o., male patient seen face to face by this provider, consulted with Dr. Lucianne Muss; and chart reviewed on 06/23/21.  Per chart review patient has a psychiatric history of MDD, GAD, ADHD, cannabis use, benzodiazepine misuse and stimulant misuse.  He has psychiatric services in place with [facility].  His UDS upon admission is positive for benzodiazepines and marijuana.  During evaluation Jeffery Morales is sitting in his bed awake in no acute distress.  He is alert/oriented x4, cooperative, and attentive.  His speech is clear, coherent, normal rate and tone.  He is slightly anxious and makes good eye contact.  He endorses depression related to his benzodiazepine use.  States, "I just want to get past this phase".  He denies SI/HI/AVH.  He reports anxiety as his only withdrawal symptom.  Reports his fear of having seizure during his withdrawal.  Patient reports his plan upon discharge is to follow-up with his outpatient psychiatric provider to continue his benzodiazepine taper.   Diagnosis:  Final diagnoses:  Benzodiazepine misuse  Benzodiazepine withdrawal, uncomplicated (HCC)  Stimulant abuse (HCC)  Tobacco use disorder  MDD (major depressive disorder), recurrent episode, mild (HCC)  GAD (generalized anxiety disorder)    Total Time spent with patient: 30 minutes  Past Psychiatric History: See H&P see H&P Past Medical History:  Past Medical History:  Diagnosis Date   Acne    ADHD (attention deficit hyperactivity disorder)    ADD   Asthma    hx of asthma, has albuterol that he uses w/ assoc bronchitis   At risk for impaired digestion    has an appt to see gastroenterologist    Hematuria    w/ dysuria   Seasonal allergies     Past Surgical History:  Procedure Laterality Date   ADENOIDECTOMY     CYSTOSCOPY WITH URETHRAL DILATATION N/A 02/27/2012   Procedure: CYSTOSCOPY WITH URETHRAL DILATATION;  Surgeon: Valetta Fuller, MD;  [facility]  Service: Urology;  Laterality: N/A;  30 MIN  URETHRAL DILATION    MYRINGOTOMY WITH TUBE PLACEMENT     TONSILLECTOMY     Family History:  Family History  Problem Relation Age of Onset   Hypertension Mother    Dementia Sister    Alcohol abuse Sister    Alcohol abuse Paternal Uncle    Heart disease Maternal Grandfather    Hyperlipidemia Maternal Grandfather    Diabetes Maternal Grandfather    Heart disease Maternal Grandmother    Hyperlipidemia Maternal Grandmother    Diabetes Maternal Grandmother    Alcohol abuse Paternal Grandfather    Family Psychiatric  History: See H&P  Social History:  Social History   Substance and Sexual Activity  Alcohol Use Yes   Comment: per mom     Social History   Substance and Sexual Activity  Drug Use Not Currently    Social History   Socioeconomic History   Marital status: Single    Spouse name: Not on file   Number of children: Not on file   Years of education: Not on file   Highest education level: Not on file  Occupational History   Not on file  Tobacco Use  Smoking status: Never   Smokeless tobacco: Never  Substance and Sexual Activity   Alcohol use: Yes    Comment: per mom   Drug use: Not Currently   Sexual activity: Not Currently    Comment: per mom  Other Topics Concern   Not on file  Social History Narrative   Not on file   Social Determinants of Health   Financial Resource Strain: Not on file  Food Insecurity: Not on file  Transportation Needs: Not on file  Physical Activity: Not on file  Stress: Not on file  Social Connections: Not on file   SDOH:  SDOH Screenings   Alcohol Screen: Not on file  Depression  (PHQ2-9): Medium Risk (06/22/2021)   Depression (PHQ2-9)    PHQ-2 Score: 19  Financial Resource Strain: Not on file  Food Insecurity: Not on file  Housing: Not on file  Physical Activity: Not on file  Social Connections: Not on file  Stress: Not on file  Tobacco Use: Low Risk  (06/22/2021)   Patient History    Smoking Tobacco Use: Never    Smokeless Tobacco Use: Never    Passive Exposure: Not on file  Transportation Needs: Not on file   Additional Social History:    Pain Medications: see MAR Prescriptions: see MAR Over the Counter: see MAR History of alcohol / drug use?: Yes Name of Substance 1: xanax 1 - Age of First Use: 26 1 - Amount (size/oz):  1 - Frequency: daily 1 - Duration: ongoing 1 - Last Use / Amount: 06/15/2021 1 - Method of Aquiring: buying off street 1- Route of Use: oral                  Sleep: Fair  Appetite:  Fair  Current Medications:  Current Facility-Administered Medications  Medication Dose Route Frequency Provider Last Rate Last Admin   acetaminophen (TYLENOL) tablet 650 mg  650 mg Oral Q6H PRN Princess Bruins, DO       alum & mag hydroxide-simeth (MAALOX/MYLANTA) 200-200-20 MG/5ML suspension 30 mL  30 mL Oral Q4H PRN Princess Bruins, DO       chlordiazePOXIDE (LIBRIUM) capsule 25 mg  25 mg Oral Q6H PRN Princess Bruins, DO       chlordiazePOXIDE (LIBRIUM) capsule 25 mg  25 mg Oral TID Princess Bruins, DO   25 mg at 06/23/21 1000   Followed by   Melene Muller ON 06/24/2021] chlordiazePOXIDE (LIBRIUM) capsule 25 mg  25 mg Oral BH-qamhs Princess Bruins, DO       Followed by   Melene Muller ON 06/25/2021] chlordiazePOXIDE (LIBRIUM) capsule 25 mg  25 mg Oral Daily Princess Bruins, DO       desvenlafaxine (PRISTIQ) 24 hr tablet 50 mg  50 mg Oral Daily Estella Husk, MD   50 mg at 06/23/21 1006   dicyclomine (BENTYL) capsule 10 mg  10 mg Oral Q6H PRN Princess Bruins, DO       fenofibrate tablet 160 mg  160 mg Oral Daily Princess Bruins, DO   160 mg at 06/23/21 0959    folic acid (FOLVITE) tablet 1 mg  1 mg Oral Daily Princess Bruins, DO   1 mg at 06/23/21 0959   hydrOXYzine (ATARAX) tablet 25 mg  25 mg Oral Q6H PRN Princess Bruins, DO       lipase/protease/amylase (CREON) capsule 36,000 Units  36,000 Units Oral TID Glean Hess, DO   36,000 Units at 06/23/21 1118   loperamide (IMODIUM) capsule 2-4 mg  2-4  mg Oral PRN Princess Bruins, DO       magnesium hydroxide (MILK OF MAGNESIA) suspension 30 mL  30 mL Oral Daily PRN Princess Bruins, DO       mirtazapine (REMERON) tablet 15 mg  15 mg Oral QHS Princess Bruins, DO   15 mg at 06/22/21 2126   multivitamin with minerals tablet 1 tablet  1 tablet Oral Daily Princess Bruins, DO   1 tablet at 06/23/21 0959   nicotine (NICODERM CQ - dosed in mg/24 hours) patch 14 mg  14 mg Transdermal Daily PRN Princess Bruins, DO       nicotine (NICODERM CQ - dosed in mg/24 hours) patch 21 mg  21 mg Transdermal Daily Vernard Gambles H, NP   21 mg at 06/23/21 1118   ondansetron (ZOFRAN-ODT) disintegrating tablet 4 mg  4 mg Oral Q6H PRN Princess Bruins, DO       pantoprazole (PROTONIX) EC tablet 40 mg  40 mg Oral Daily Princess Bruins, DO   40 mg at 06/23/21 0959   rosuvastatin (CRESTOR) tablet 20 mg  20 mg Oral QHS Princess Bruins, DO   20 mg at 06/22/21 2126   sucralfate (CARAFATE) tablet 1 g  1 g Oral BID Princess Bruins, DO   1 g at 06/23/21 2263   thiamine tablet 100 mg  100 mg Oral Daily Princess Bruins, DO   100 mg at 06/23/21 3354   Current Outpatient Medications  Medication Sig Dispense Refill   cetirizine (ZYRTEC) 10 MG tablet Take 10 mg by mouth daily.     clonazePAM (KLONOPIN) 0.5 MG tablet Take 0.5 mg by mouth in the morning, at noon, and at bedtime.     desvenlafaxine (PRISTIQ) 50 MG 24 hr tablet SMARTSIG:1 Tablet(s) By Mouth Every Evening (Patient taking differently: 50 mg daily.) 30 tablet 2   dicyclomine (BENTYL) 10 MG capsule Take 10 mg by mouth in the morning, at noon, and at bedtime.     fenofibrate micronized (LOFIBRA) 200 MG  capsule Take 200 mg by mouth daily.     icosapent Ethyl (VASCEPA) 1 g capsule 2 g 2 (two) times daily.     methylphenidate 18 MG PO CR tablet Take 18 mg by mouth daily as needed (For ADHD).     metoprolol succinate (TOPROL-XL) 25 MG 24 hr tablet Take 25 mg by mouth daily.     mirtazapine (REMERON) 15 MG tablet Take 15 mg by mouth at bedtime.     Multiple Vitamin (DAILY VITES) tablet Take 1 tablet by mouth daily.     omeprazole (PRILOSEC) 40 MG capsule Take 40 mg by mouth every morning.     ondansetron (ZOFRAN) 4 MG tablet Take 4 mg by mouth daily as needed for nausea or vomiting.     Polyethyl Glycol-Propyl Glycol (SYSTANE OP) Place 2 drops into both eyes in the morning, at noon, and at bedtime.     rosuvastatin (CRESTOR) 20 MG tablet Take 20 mg by mouth at bedtime.     sucralfate (CARAFATE) 1 g tablet Take 1 g by mouth 2 (two) times daily.     ZENPEP 40000-126000 units CPEP Take 2 capsules by mouth in the morning, at noon, and at bedtime.     folic acid (FOLVITE) 1 MG tablet Take 1 tablet by mouth daily.      Labs  Lab Results:  Admission on 06/22/2021  Component Date Value Ref Range Status   SARS Coronavirus 2 by RT PCR 06/22/2021 NEGATIVE  NEGATIVE Final  Comment: (NOTE) SARS-CoV-2 target nucleic acids are NOT DETECTED.  The SARS-CoV-2 RNA is generally detectable in upper respiratory specimens during the acute phase of infection. The lowest concentration of SARS-CoV-2 viral copies this assay can detect is 138 copies/mL. A negative result does not preclude SARS-Cov-2 infection and should not be used as the sole basis for treatment or other patient management decisions. A negative result may occur with  improper specimen collection/handling, submission of specimen other than nasopharyngeal swab, presence of viral mutation(s) within the areas targeted by this assay, and inadequate number of viral copies(<138 copies/mL). A negative result must be combined with clinical observations,  patient history, and epidemiological information. The expected result is Negative.  Fact Sheet for Patients:  BloggerCourse.com  Fact Sheet for Healthcare Providers:  SeriousBroker.it  This test is no                          t yet approved or cleared by the [place] FDA and  has been authorized for detection and/or diagnosis of SARS-CoV-2 by FDA under an Emergency Use Authorization (EUA). This EUA will remain  in effect (meaning this test can be used) for the duration of the COVID-19 declaration under Section 564(b)(1) of the Act, 21 U.S.C.section 360bbb-3(b)(1), unless the authorization is terminated  or revoked sooner.       Influenza A by PCR 06/22/2021 NEGATIVE  NEGATIVE Final   Influenza B by PCR 06/22/2021 NEGATIVE  NEGATIVE Final   Comment: (NOTE) The Xpert Xpress SARS-CoV-2/FLU/RSV plus assay is intended as an aid in the diagnosis of influenza from Nasopharyngeal swab specimens and should not be used as a sole basis for treatment. Nasal washings and aspirates are unacceptable for Xpert Xpress SARS-CoV-2/FLU/RSV testing.  Fact Sheet for Patients: BloggerCourse.com  Fact Sheet for Healthcare Providers: SeriousBroker.it  This test is not yet approved or cleared by the [place] FDA and has been authorized for detection and/or diagnosis of SARS-CoV-2 by FDA under an Emergency Use Authorization (EUA). This EUA will remain in effect (meaning this test can be used) for the duration of the COVID-19 declaration under Section 564(b)(1) of the Act, 21 U.S.C. section 360bbb-3(b)(1), unless the authorization is terminated or revoked.  Performed at [facility], [address], [place], [place] [address]    WBC 06/22/2021 TEST REQUEST RECEIVED WITHOUT APPROPRIATE SPECIMEN  4.0 - 10.5 K/uL Final   RBC 06/22/2021 TEST REQUEST RECEIVED WITHOUT APPROPRIATE SPECIMEN   4.22 - 5.81 MIL/uL Final   Hemoglobin 06/22/2021 TEST REQUEST RECEIVED WITHOUT APPROPRIATE SPECIMEN  13.0 - 17.0 g/dL Final   HCT 41/28/7867 TEST REQUEST RECEIVED WITHOUT APPROPRIATE SPECIMEN  39.0 - 52.0 % Final   MCV 06/22/2021 TEST REQUEST RECEIVED WITHOUT APPROPRIATE SPECIMEN  80.0 - 100.0 fL Final   [facility] 06/22/2021 TEST REQUEST RECEIVED WITHOUT APPROPRIATE SPECIMEN  26.0 - 34.0 pg Final   MCHC 06/22/2021 TEST REQUEST RECEIVED WITHOUT APPROPRIATE SPECIMEN  30.0 - 36.0 g/dL Final   RDW 67/20/9470 TEST REQUEST RECEIVED WITHOUT APPROPRIATE SPECIMEN  11.5 - 15.5 % Final   Platelets 06/22/2021 TEST REQUEST RECEIVED WITHOUT APPROPRIATE SPECIMEN  150 - 400 K/uL Final   nRBC 06/22/2021 TEST REQUEST RECEIVED WITHOUT APPROPRIATE SPECIMEN  0.0 - 0.2 % Final   Neutrophils Relative % 06/22/2021 PENDING  % Incomplete   Neutro Abs 06/22/2021 PENDING  1.7 - 7.7 K/uL Incomplete   Band Neutrophils 06/22/2021 PENDING  % Incomplete   Lymphocytes Relative 06/22/2021 PENDING  % Incomplete  Lymphs Abs 06/22/2021 PENDING  0.7 - 4.0 K/uL Incomplete   Monocytes Relative 06/22/2021 PENDING  % Incomplete   Monocytes Absolute 06/22/2021 PENDING  0.1 - 1.0 K/uL Incomplete   Eosinophils Relative 06/22/2021 PENDING  % Incomplete   Eosinophils Absolute 06/22/2021 PENDING  0.0 - 0.5 K/uL Incomplete   Basophils Relative 06/22/2021 PENDING  % Incomplete   Basophils Absolute 06/22/2021 PENDING  0.0 - 0.1 K/uL Incomplete   WBC Morphology 06/22/2021 PENDING   Incomplete   RBC Morphology 06/22/2021 PENDING   Incomplete   Smear Review 06/22/2021 PENDING   Incomplete   Other 06/22/2021 PENDING  % Incomplete   nRBC 06/22/2021 PENDING  0 /100 WBC Incomplete   Metamyelocytes Relative 06/22/2021 PENDING  % Incomplete   Myelocytes 06/22/2021 PENDING  % Incomplete   Promyelocytes Relative 06/22/2021 PENDING  % Incomplete   Blasts 06/22/2021 PENDING  % Incomplete   Immature Granulocytes 06/22/2021 PENDING  % Incomplete   Abs  Immature Granulocytes 06/22/2021 PENDING  0.00 - 0.07 K/uL Incomplete   Sodium 06/22/2021 143  135 - 145 mmol/L Final   Potassium 06/22/2021 4.1  3.5 - 5.1 mmol/L Final   Chloride 06/22/2021 108  98 - 111 mmol/L Final   CO2 06/22/2021 24  22 - 32 mmol/L Final   Glucose, Bld 06/22/2021 106 (H)  70 - 99 mg/dL Final   Glucose reference range applies only to samples taken after fasting for at least 8 hours.   BUN 06/22/2021 20  6 - 20 mg/dL Final   Creatinine, Ser 06/22/2021 0.85  0.61 - 1.24 mg/dL Final   Calcium 78/58/8502 10.1  8.9 - 10.3 mg/dL Final   Total Protein 77/41/2878 7.9  6.5 - 8.1 g/dL Final   Albumin 67/67/2094 5.2 (H)  3.5 - 5.0 g/dL Final   AST 70/96/2836 13 (L)  15 - 41 U/L Final   ALT 06/22/2021 14  0 - 44 U/L Final   Alkaline Phosphatase 06/22/2021 39  38 - 126 U/L Final   Total Bilirubin 06/22/2021 0.9  0.3 - 1.2 mg/dL Final   GFR, Estimated 06/22/2021 >60  >60 mL/min Final   Comment: (NOTE) Calculated using the CKD-EPI Creatinine Equation (2021)    Anion gap 06/22/2021 11  5 - 15 Final   Performed at [facility], [address], [place], [place] [address]   TSH 06/22/2021 1.294  0.350 - 4.500 uIU/mL Final   Comment: Performed by a 3rd Generation assay with a functional sensitivity of <=0.01 uIU/mL. Performed at [facility], [address], [place], [place] [address]    Color, Urine 06/22/2021 AMBER (A)  YELLOW Final   BIOCHEMICALS MAY BE AFFECTED BY COLOR   APPearance 06/22/2021 CLOUDY (A)  CLEAR Final   Specific Gravity, Urine 06/22/2021 1.026  1.005 - 1.030 Final   pH 06/22/2021 7.0  5.0 - 8.0 Final   Glucose, UA 06/22/2021 NEGATIVE  NEGATIVE mg/dL Final   Hgb urine dipstick 06/22/2021 NEGATIVE  NEGATIVE Final   Bilirubin Urine 06/22/2021 NEGATIVE  NEGATIVE Final   Ketones, ur 06/22/2021 NEGATIVE  NEGATIVE mg/dL Final   Protein, ur 03/54/6568 NEGATIVE  NEGATIVE mg/dL Final   Nitrite 12/75/1700 NEGATIVE  NEGATIVE Final   Leukocytes,Ua 06/22/2021  NEGATIVE  NEGATIVE Final   Performed at [facility], [address] [place], [place] [address]   POC Amphetamine UR 06/22/2021 None Detected  NONE DETECTED (Cut Off Level 1000 ng/mL) Final   POC Secobarbital (BAR) 06/22/2021 None Detected  NONE  DETECTED (Cut Off Level 300 ng/mL) Final   POC Buprenorphine (BUP) 06/22/2021 None Detected  NONE DETECTED (Cut Off Level 10 ng/mL) Final   POC Oxazepam (BZO) 06/22/2021 Positive (A)  NONE DETECTED (Cut Off Level 300 ng/mL) Final   POC Cocaine UR 06/22/2021 None Detected  NONE DETECTED (Cut Off Level 300 ng/mL) Final   POC Methamphetamine UR 06/22/2021 None Detected  NONE DETECTED (Cut Off Level 1000 ng/mL) Final   POC Morphine 06/22/2021 None Detected  NONE DETECTED (Cut Off Level 300 ng/mL) Final   POC Methadone UR 06/22/2021 None Detected  NONE DETECTED (Cut Off Level 300 ng/mL) Final   POC Oxycodone UR 06/22/2021 None Detected  NONE DETECTED (Cut Off Level 100 ng/mL) Final   POC Marijuana UR 06/22/2021 Positive (A)  NONE DETECTED (Cut Off Level 50 ng/mL) Final   SARSCOV2ONAVIRUS 2 AG 06/22/2021 NEGATIVE  NEGATIVE Final   Comment: (NOTE) SARS-CoV-2 antigen NOT DETECTED.   Negative results are presumptive.  Negative results do not preclude SARS-CoV-2 infection and should not be used as the sole basis for treatment or other patient management decisions, including infection  control decisions, particularly in the presence of clinical signs and  symptoms consistent with COVID-19, or in those who have been in contact with the virus.  Negative results must be combined with clinical observations, patient history, and epidemiological information. The expected result is Negative.  Fact Sheet for Patients: https://www.jennings-kim.com/  Fact Sheet for Healthcare Providers: https://alexander-rogers.biz/  This test is not yet approved or cleared by the [place] FDA and  has been authorized for detection and/or  diagnosis of SARS-CoV-2 by FDA under an Emergency Use Authorization (EUA).  This EUA will remain in effect (meaning this test can be used) for the duration of  the COV                          ID-19 declaration under Section 564(b)(1) of the Act, 21 U.S.C. section 360bbb-3(b)(1), unless the authorization is terminated or revoked sooner.     Cholesterol 06/22/2021 115  0 - 200 mg/dL Final   Triglycerides 51/02/5850 95  <150 mg/dL Final   HDL 77/82/4235 37 (L)  >40 mg/dL Final   Total CHOL/HDL Ratio 06/22/2021 3.1  RATIO Final   VLDL 06/22/2021 19  0 - 40 mg/dL Final   LDL Cholesterol 06/22/2021 59  0 - 99 mg/dL Final   Comment:        Total Cholesterol/HDL:CHD Risk Coronary Heart Disease Risk Table                     Men   Women  1/2 Average Risk   3.4   3.3  Average Risk       5.0   4.4  2 X Average Risk   9.6   7.1  3 X Average Risk  23.4   11.0        Use the calculated Patient Ratio above and the CHD Risk Table to determine the patient's CHD Risk.        ATP III CLASSIFICATION (LDL):  <100     mg/dL   Optimal  361-443  mg/dL   Near or Above                    Optimal  130-159  mg/dL   Borderline  154-008  mg/dL   High  >676     mg/dL   Very High  Performed at [facility], [address] [place], [place] [address]     Blood Alcohol level:  No results found for: "ETH"  Metabolic Disorder Labs: No results found for: "HGBA1C", "MPG" No results found for: "PROLACTIN" Lab Results  Component Value Date   CHOL 115 06/22/2021   TRIG 95 06/22/2021   HDL 37 (L) 06/22/2021   CHOLHDL 3.1 06/22/2021   VLDL 19 06/22/2021   LDLCALC 59 06/22/2021    Therapeutic Lab Levels: No results found for: "LITHIUM" No results found for: "VALPROATE" No results found for: "CBMZ"  Physical Findings   PHQ2-9    [facility]  PHQ-2 Total Score 4  PHQ-9 Total Score 19      Flowsheet Row ED [facility]20[facility]  C-SSRS RISK CATEGORY No Risk        Musculoskeletal  Strength & Muscle Tone: within normal limits Gait & Station: normal Patient leans: N/A  Psychiatric Specialty Exam  Presentation  General Appearance: Appropriate for Environment; Casual  Eye Contact:Good  Speech:Clear and Coherent; Normal Rate  Speech Volume:Normal  Handedness:Right   Mood and Affect  Mood:Anxious; Depressed  Affect:Congruent   Thought Process  Thought Processes:Coherent  Descriptions of Associations:Intact  Orientation:Full (Time, Place and Person)  Thought Content:Logical  Diagnosis of Schizophrenia or Schizoaffective disorder in past: No    Hallucinations:Hallucinations: None  Ideas of Reference:None  Suicidal Thoughts:Suicidal Thoughts: No  Homicidal Thoughts:Homicidal Thoughts: No   Sensorium  Memory:Immediate Good; Recent Good; Remote Good  Judgment:Fair  Insight:Good   Executive Functions  Concentration:Good  Attention Span:Good  Recall:Good  Fund of Knowledge:Good  Language:Good   Psychomotor Activity  Psychomotor Activity:Psychomotor Activity: Normal   Assets  Assets:Communication Skills; Desire for Improvement; Financial Resources/Insurance; Housing; Physical Health; Resilience; Social Support   Sleep  Sleep:Sleep: Fair   Nutritional Assessment (For OBS and FBC admissions only) Has the patient had a weight loss or gain of 10 pounds or more in the last 3 months?: No Has the patient had a decrease in food intake/or appetite?: No Does the patient have dental problems?: No Does the patient have eating habits or behaviors that may be indicators of an eating disorder including binging or inducing vomiting?: No    Physical Exam  Physical Exam Vitals and nursing note reviewed.  Constitutional:      General: He is not in acute distress.    Appearance: Normal appearance. He is well-developed.  HENT:     Head:  Normocephalic and atraumatic.  Eyes:     Conjunctiva/sclera: Conjunctivae normal.  Cardiovascular:     Rate and Rhythm: Normal rate.  Pulmonary:     Effort: Pulmonary effort is normal. No respiratory distress.  Musculoskeletal:        General: Normal range of motion.     Cervical back: Normal range of motion.  Skin:    Coloration: Skin is not jaundiced or pale.  Neurological:     Mental Status: He is alert and oriented to person, place, and time.  Psychiatric:        Attention and Perception: Attention and perception normal.        Mood and Affect: Mood is anxious and depressed.        Speech: Speech normal.        Behavior: Behavior normal.        Thought Content: Thought content normal.        Cognition  and Memory: Cognition normal.        Judgment: Judgment normal.    Review of Systems  Constitutional: Negative.   HENT: Negative.    Eyes: Negative.   Respiratory: Negative.    Cardiovascular: Negative.   Gastrointestinal: Negative.   Genitourinary: Negative.   Musculoskeletal: Negative.   Skin: Negative.   Neurological: Negative.   Endo/Heme/Allergies: Negative.   Psychiatric/Behavioral:  Positive for depression. The patient is nervous/anxious.    Blood pressure 122/87, pulse 71, temperature 98.3 F (36.8 C), temperature source Oral, resp. rate 18, SpO2 96 %. There is no height or weight on file to calculate BMI.  Treatment Plan Summary: Disposition: Ongoing.   Patient is not interested in residential substance abuse treatment he plans on following up with his psychiatric provider upon discharge to continue the benzodiazepine detox.  We will continue to have daily contact with patient to assess and evaluate symptoms and progress in treatment and Medication management  Ardis Hughs, NP 06/23/2021 12:42 PM

## 2021-06-23 NOTE — ED Notes (Signed)
Patient awake and alert appears bright this morning.  Able to make needs known to staff.  No evidence of withdrawal.  Denies avh shi or plan.  Will monitor and provide supportive environment.

## 2021-06-23 NOTE — Progress Notes (Signed)
Patient received 25mg  vistaril PO PRN for anxiety.  Patient is on librium taper however he continues to be anxious about having a seizure.  Education provided and he expressed understanding.  No evidence of withdrawal.  Denies avh shi or plan.  Will monitor.

## 2021-06-24 MED ORDER — NAPHAZOLINE-GLYCERIN 0.012-0.25 % OP SOLN
1.0000 [drp] | Freq: Four times a day (QID) | OPHTHALMIC | Status: DC | PRN
  Administered 2021-06-24: 1 [drp] via OPHTHALMIC
  Filled 2021-06-24: qty 15

## 2021-06-24 NOTE — BH Assessment (Signed)
LCSW Progress Note  Per Estella Husk, MD, this pt does not require psychiatric hospitalization at this time.  Pt is psychiatrically cleared.  Discharge instructions include several resources for OPT therapy and the number for mobile crisis.  EDP Estella Husk, MD, has been notified.  Hansel Starling, MSW, LCSW [facility] [phone] or [phone]

## 2021-06-24 NOTE — ED Notes (Signed)
Pt is in the bed sleeping. Respirations are even and unlabored. No acute distress noted. Will continue to monitor for safety. 

## 2021-06-24 NOTE — ED Notes (Signed)
Patient continues to rest with no sxs of distress - will continue to monitor for safety ?

## 2021-06-24 NOTE — ED Provider Notes (Addendum)
Behavioral Health Progress Note  Date and Time: 06/24/2021 1:26 PM Name: Jeffery Morales Archibeque MRN:  [id number]  Subjective:  Jeffery Morales Born 26 y.o., male patient who [facility] as a recommendation from his provider due to Xanax use on 06/22/20.  [facility] for crisis stabilization/detox and was started on librium taper and CIWA protocol.  Per chart review patient has a psychiatric history of MDD, GAD, ADHD, cannabis use, benzodiazepine misuse and stimulant misuse.  He has psychiatric services in place with [facility].  His UDS upon admission is positive for benzodiazepines and marijuana.  Patient seen and chart reviewed-most recent CIWA 0.  Patient interviewed in his room this morning.  Patient's affect is much brighter than on previous interaction.  Patient describes his mood as "great".  He reports sleeping well last night and reports having good appetite.  Patient denies nausea, vomiting, abdominal pain.  He reports mild headache.  Patient states that his anxiety is "on and off".  Patient inquires about discharge.  Discussed with patient that his taper scheduled to end tomorrow 06/25/2021 and has recommended that he remain here until completion of taper.  Patient verbalized understanding and was in agreement.  Patient reports he will follow up with his outpatient provider upon completion of taper although would like some assistance with counseling resources. No other physical complaints today apart from dry eyes. Discussed with patient that social work will be consulted to assist with this.  Patient verbalized understanding was in agreement. Patient was given the opportunity to ask questions and  All questions answered. Patient verbalized understanding regarding plan of care.    Diagnosis:  Final diagnoses:  Benzodiazepine misuse  Benzodiazepine withdrawal, uncomplicated (HCC)  Stimulant abuse (HCC)  Tobacco use disorder  MDD (major depressive disorder), recurrent  episode, mild (HCC)  GAD (generalized anxiety disorder)    Total Time spent with patient: 20 minutes  Past Psychiatric History: See H&P Past Medical History:  Past Medical History:  Diagnosis Date   Acne    ADHD (attention deficit hyperactivity disorder)    ADD   Asthma    hx of asthma, has albuterol that he uses w/ assoc bronchitis   At risk for impaired digestion    has an appt to see gastroenterologist   Hematuria    w/ dysuria   Seasonal allergies     Past Surgical History:  Procedure Laterality Date   ADENOIDECTOMY     CYSTOSCOPY WITH URETHRAL DILATATION N/A 02/27/2012   Procedure: CYSTOSCOPY WITH URETHRAL DILATATION;  Surgeon: Valetta Fulleravid S Grapey, MD;  [facility] [place];  Service: Urology;  Laterality: N/A;  30 MIN  URETHRAL DILATION    MYRINGOTOMY WITH TUBE PLACEMENT     TONSILLECTOMY     Family History:  Family History  Problem Relation Age of Onset   Hypertension Mother    Dementia Sister    Alcohol abuse Sister    Alcohol abuse Paternal Uncle    Heart disease Maternal Grandfather    Hyperlipidemia Maternal Grandfather    Diabetes Maternal Grandfather    Heart disease Maternal Grandmother    Hyperlipidemia Maternal Grandmother    Diabetes Maternal Grandmother    Alcohol abuse Paternal Grandfather    Family Psychiatric  History: See H&P  Social History:  Social History   Substance and Sexual Activity  Alcohol Use Yes   Comment: per mom     Social History   Substance and Sexual Activity  Drug Use Not  Currently    Social History   Socioeconomic History   Marital status: Single    Spouse name: Not on file   Number of children: Not on file   Years of education: Not on file   Highest education level: Not on file  Occupational History   Not on file  Tobacco Use   Smoking status: Never   Smokeless tobacco: Never  Substance and Sexual Activity   Alcohol use: Yes    Comment: per mom   Drug use: Not Currently   Sexual activity:  Not Currently    Comment: per mom  Other Topics Concern   Not on file  Social History Narrative   Not on file   Social Determinants of Health   Financial Resource Strain: Not on file  Food Insecurity: Not on file  Transportation Needs: Not on file  Physical Activity: Not on file  Stress: Not on file  Social Connections: Not on file   SDOH:  SDOH Screenings   Alcohol Screen: Not on file  Depression (PHQ2-9): Medium Risk (06/22/2021)   Depression (PHQ2-9)    PHQ-2 Score: 19  Financial Resource Strain: Not on file  Food Insecurity: Not on file  Housing: Not on file  Physical Activity: Not on file  Social Connections: Not on file  Stress: Not on file  Tobacco Use: Low Risk  (06/22/2021)   Patient History    Smoking Tobacco Use: Never    Smokeless Tobacco Use: Never    Passive Exposure: Not on file  Transportation Needs: Not on file   Additional Social History:    Pain Medications: see MAR Prescriptions: see MAR Over the Counter: see MAR History of alcohol / drug use?: Yes Name of Substance 1: xanax 1 - Age of First Use: 26 1 - Amount (size/oz): 4mg  1 - Frequency: daily 1 - Duration: ongoing 1 - Last Use / Amount: 06/15/2021 1 - Method of Aquiring: buying off street 1- Route of Use: oral                  Sleep: Good  Appetite:  Good  Current Medications:  Current Facility-Administered Medications  Medication Dose Route Frequency Provider Last Rate Last Admin   acetaminophen (TYLENOL) tablet 650 mg  650 mg Oral Q6H PRN 06/17/2021, DO       alum & mag hydroxide-simeth (MAALOX/MYLANTA) 200-200-20 MG/5ML suspension 30 mL  30 mL Oral Q4H PRN 07-12-2000, DO       chlordiazePOXIDE (LIBRIUM) capsule 25 mg  25 mg Oral Q6H PRN Princess Bruins, DO       chlordiazePOXIDE (LIBRIUM) capsule 25 mg  25 mg Oral Princess Bruins, DO   25 mg at 06/24/21 06/26/21   Followed by   3790 ON 06/25/2021] chlordiazePOXIDE (LIBRIUM) capsule 25 mg  25 mg Oral Daily 06/27/2021, DO       desvenlafaxine (PRISTIQ) 24 hr tablet 50 mg  50 mg Oral Daily Princess Bruins, MD   50 mg at 06/24/21 0906   dicyclomine (BENTYL) capsule 10 mg  10 mg Oral Q6H PRN 06/26/21, DO   10 mg at 06/24/21 0748   fenofibrate tablet 160 mg  160 mg Oral Daily 06/26/21, DO   160 mg at 06/24/21 06/26/21   folic acid (FOLVITE) tablet 1 mg  1 mg Oral Daily 2409, DO   1 mg at 06/24/21 0906   hydrOXYzine (ATARAX) tablet 25 mg  25 mg Oral Q6H PRN 06/26/21,  DO   25 mg at 06/24/21 1014   lipase/protease/amylase (CREON) capsule 36,000 Units  36,000 Units Oral TID Glean Hess, DO   36,000 Units at 06/24/21 1206   loperamide (IMODIUM) capsule 2-4 mg  2-4 mg Oral PRN Princess Bruins, DO       magnesium hydroxide (MILK OF MAGNESIA) suspension 30 mL  30 mL Oral Daily PRN Princess Bruins, DO       mirtazapine (REMERON) tablet 15 mg  15 mg Oral QHS Princess Bruins, DO   15 mg at 06/23/21 2112   multivitamin with minerals tablet 1 tablet  1 tablet Oral Daily Princess Bruins, DO   1 tablet at 06/24/21 4098   naphazoline-glycerin (CLEAR EYES REDNESS) ophth solution 1-2 drop  1-2 drop Both Eyes QID PRN Estella Husk, MD       nicotine (NICODERM CQ - dosed in mg/24 hours) patch 14 mg  14 mg Transdermal Daily PRN Princess Bruins, DO       nicotine (NICODERM CQ - dosed in mg/24 hours) patch 21 mg  21 mg Transdermal Daily Vernard Gambles H, NP   21 mg at 06/24/21 0904   ondansetron (ZOFRAN-ODT) disintegrating tablet 4 mg  4 mg Oral Q6H PRN Princess Bruins, DO       pantoprazole (PROTONIX) EC tablet 40 mg  40 mg Oral Daily Princess Bruins, DO   40 mg at 06/24/21 1191   rosuvastatin (CRESTOR) tablet 20 mg  20 mg Oral QHS Princess Bruins, DO   20 mg at 06/23/21 2113   sucralfate (CARAFATE) tablet 1 g  1 g Oral BID Princess Bruins, DO   1 g at 06/24/21 4782   thiamine tablet 100 mg  100 mg Oral Daily Princess Bruins, DO   100 mg at 06/24/21 9562   Current Outpatient Medications  Medication Sig  Dispense Refill   cetirizine (ZYRTEC) 10 MG tablet Take 10 mg by mouth daily.     clonazePAM (KLONOPIN) 0.5 MG tablet Take 0.5 mg by mouth in the morning, at noon, and at bedtime.     desvenlafaxine (PRISTIQ) 50 MG 24 hr tablet SMARTSIG:1 Tablet(s) By Mouth Every Evening (Patient taking differently: 50 mg daily.) 30 tablet 2   dicyclomine (BENTYL) 10 MG capsule Take 10 mg by mouth in the morning, at noon, and at bedtime.     fenofibrate micronized (LOFIBRA) 200 MG capsule Take 200 mg by mouth daily.     icosapent Ethyl (VASCEPA) 1 g capsule 2 g 2 (two) times daily.     methylphenidate 18 MG PO CR tablet Take 18 mg by mouth daily as needed (For ADHD).     metoprolol succinate (TOPROL-XL) 25 MG 24 hr tablet Take 25 mg by mouth daily.     mirtazapine (REMERON) 15 MG tablet Take 15 mg by mouth at bedtime.     Multiple Vitamin (DAILY VITES) tablet Take 1 tablet by mouth daily.     omeprazole (PRILOSEC) 40 MG capsule Take 40 mg by mouth every morning.     ondansetron (ZOFRAN) 4 MG tablet Take 4 mg by mouth daily as needed for nausea or vomiting.     Polyethyl Glycol-Propyl Glycol (SYSTANE OP) Place 2 drops into both eyes in the morning, at noon, and at bedtime.     rosuvastatin (CRESTOR) 20 MG tablet Take 20 mg by mouth at bedtime.     sucralfate (CARAFATE) 1 g tablet Take 1 g by mouth 2 (two) times daily.     ZENPEP [id number]  units CPEP Take 2 capsules by mouth in the morning, at noon, and at bedtime.     folic acid (FOLVITE) 1 MG tablet Take 1 tablet by mouth daily.      Labs  Lab Results:  Admission on 06/22/2021  Component Date Value Ref Range Status   SARS Coronavirus 2 by RT PCR 06/22/2021 NEGATIVE  NEGATIVE Final   Comment: (NOTE) SARS-CoV-2 target nucleic acids are NOT DETECTED.  The SARS-CoV-2 RNA is generally detectable in upper respiratory specimens during the acute phase of infection. The lowest concentration of SARS-CoV-2 viral copies this assay can detect is 138 copies/mL. A  negative result does not preclude SARS-Cov-2 infection and should not be used as the sole basis for treatment or other patient management decisions. A negative result may occur with  improper specimen collection/handling, submission of specimen other than nasopharyngeal swab, presence of viral mutation(s) within the areas targeted by this assay, and inadequate number of viral copies(<138 copies/mL). A negative result must be combined with clinical observations, patient history, and epidemiological information. The expected result is Negative.  Fact Sheet for Patients:  BloggerCourse.com  Fact Sheet for Healthcare Providers:  SeriousBroker.it  This test is no                          t yet approved or cleared by the [place] FDA and  has been authorized for detection and/or diagnosis of SARS-CoV-2 by FDA under an Emergency Use Authorization (EUA). This EUA will remain  in effect (meaning this test can be used) for the duration of the COVID-19 declaration under Section 564(b)(1) of the Act, 21 U.S.C.section 360bbb-3(b)(1), unless the authorization is terminated  or revoked sooner.       Influenza A by PCR 06/22/2021 NEGATIVE  NEGATIVE Final   Influenza B by PCR 06/22/2021 NEGATIVE  NEGATIVE Final   Comment: (NOTE) The Xpert Xpress SARS-CoV-2/FLU/RSV plus assay is intended as an aid in the diagnosis of influenza from Nasopharyngeal swab specimens and should not be used as a sole basis for treatment. Nasal washings and aspirates are unacceptable for Xpert Xpress SARS-CoV-2/FLU/RSV testing.  Fact Sheet for Patients: BloggerCourse.com  Fact Sheet for Healthcare Providers: SeriousBroker.it  This test is not yet approved or cleared by the [place] FDA and has been authorized for detection and/or diagnosis of SARS-CoV-2 by FDA under an Emergency Use Authorization (EUA). This  EUA will remain in effect (meaning this test can be used) for the duration of the COVID-19 declaration under Section 564(b)(1) of the Act, 21 U.S.C. section 360bbb-3(b)(1), unless the authorization is terminated or revoked.  Performed at [facility], [address], [place], [place] [address]    WBC 06/22/2021 TEST REQUEST RECEIVED WITHOUT APPROPRIATE SPECIMEN  4.0 - 10.5 K/uL Final   RBC 06/22/2021 TEST REQUEST RECEIVED WITHOUT APPROPRIATE SPECIMEN  4.22 - 5.81 MIL/uL Final   Hemoglobin 06/22/2021 TEST REQUEST RECEIVED WITHOUT APPROPRIATE SPECIMEN  13.0 - 17.0 g/dL Final   HCT 91/47/8295 TEST REQUEST RECEIVED WITHOUT APPROPRIATE SPECIMEN  39.0 - 52.0 % Final   MCV 06/22/2021 TEST REQUEST RECEIVED WITHOUT APPROPRIATE SPECIMEN  80.0 - 100.0 fL Final   [facility] 06/22/2021 TEST REQUEST RECEIVED WITHOUT APPROPRIATE SPECIMEN  26.0 - 34.0 pg Final   MCHC 06/22/2021 TEST REQUEST RECEIVED WITHOUT APPROPRIATE SPECIMEN  30.0 - 36.0 g/dL Final   RDW 62/13/0865 TEST REQUEST RECEIVED WITHOUT APPROPRIATE SPECIMEN  11.5 - 15.5 % Final   Platelets 06/22/2021 TEST REQUEST  RECEIVED WITHOUT APPROPRIATE SPECIMEN  150 - 400 K/uL Final   nRBC 06/22/2021 TEST REQUEST RECEIVED WITHOUT APPROPRIATE SPECIMEN  0.0 - 0.2 % Final   Neutrophils Relative % 06/22/2021 PENDING  % Incomplete   Neutro Abs 06/22/2021 PENDING  1.7 - 7.7 K/uL Incomplete   Band Neutrophils 06/22/2021 PENDING  % Incomplete   Lymphocytes Relative 06/22/2021 PENDING  % Incomplete   Lymphs Abs 06/22/2021 PENDING  0.7 - 4.0 K/uL Incomplete   Monocytes Relative 06/22/2021 PENDING  % Incomplete   Monocytes Absolute 06/22/2021 PENDING  0.1 - 1.0 K/uL Incomplete   Eosinophils Relative 06/22/2021 PENDING  % Incomplete   Eosinophils Absolute 06/22/2021 PENDING  0.0 - 0.5 K/uL Incomplete   Basophils Relative 06/22/2021 PENDING  % Incomplete   Basophils Absolute 06/22/2021 PENDING  0.0 - 0.1 K/uL Incomplete   WBC Morphology 06/22/2021 PENDING    Incomplete   RBC Morphology 06/22/2021 PENDING   Incomplete   Smear Review 06/22/2021 PENDING   Incomplete   Other 06/22/2021 PENDING  % Incomplete   nRBC 06/22/2021 PENDING  0 /100 WBC Incomplete   Metamyelocytes Relative 06/22/2021 PENDING  % Incomplete   Myelocytes 06/22/2021 PENDING  % Incomplete   Promyelocytes Relative 06/22/2021 PENDING  % Incomplete   Blasts 06/22/2021 PENDING  % Incomplete   Immature Granulocytes 06/22/2021 PENDING  % Incomplete   Abs Immature Granulocytes 06/22/2021 PENDING  0.00 - 0.07 K/uL Incomplete   Sodium 06/22/2021 143  135 - 145 mmol/L Final   Potassium 06/22/2021 4.1  3.5 - 5.1 mmol/L Final   Chloride 06/22/2021 108  98 - 111 mmol/L Final   CO2 06/22/2021 24  22 - 32 mmol/L Final   Glucose, Bld 06/22/2021 106 (H)  70 - 99 mg/dL Final   Glucose reference range applies only to samples taken after fasting for at least 8 hours.   BUN 06/22/2021 20  6 - 20 mg/dL Final   Creatinine, Ser 06/22/2021 0.85  0.61 - 1.24 mg/dL Final   Calcium 16/10/9602 10.1  8.9 - 10.3 mg/dL Final   Total Protein 54/09/8117 7.9  6.5 - 8.1 g/dL Final   Albumin 14/78/2956 5.2 (H)  3.5 - 5.0 g/dL Final   AST 21/30/8657 13 (L)  15 - 41 U/L Final   ALT 06/22/2021 14  0 - 44 U/L Final   Alkaline Phosphatase 06/22/2021 39  38 - 126 U/L Final   Total Bilirubin 06/22/2021 0.9  0.3 - 1.2 mg/dL Final   GFR, Estimated 06/22/2021 >60  >60 mL/min Final   Comment: (NOTE) Calculated using the CKD-EPI Creatinine Equation (2021)    Anion gap 06/22/2021 11  5 - 15 Final   Performed at [facility], [address] [place], [place] [address]   TSH 06/22/2021 1.294  0.350 - 4.500 uIU/mL Final   Comment: Performed by a 3rd Generation assay with a functional sensitivity of <=0.01 uIU/mL. Performed at [facility], [address] [place], [place] [address]    Color, Urine 06/22/2021 AMBER (A)  YELLOW Final   BIOCHEMICALS MAY BE AFFECTED BY COLOR   APPearance 06/22/2021 CLOUDY (A)   CLEAR Final   Specific Gravity, Urine 06/22/2021 1.026  1.005 - 1.030 Final   pH 06/22/2021 7.0  5.0 - 8.0 Final   Glucose, UA 06/22/2021 NEGATIVE  NEGATIVE mg/dL Final   Hgb urine dipstick 06/22/2021 NEGATIVE  NEGATIVE Final   Bilirubin Urine 06/22/2021 NEGATIVE  NEGATIVE Final   Ketones, ur 06/22/2021 NEGATIVE  NEGATIVE mg/dL Final  Protein, ur 06/22/2021 NEGATIVE  NEGATIVE mg/dL Final   Nitrite 38/18/2993 NEGATIVE  NEGATIVE Final   Leukocytes,Ua 06/22/2021 NEGATIVE  NEGATIVE Final   Performed at [facility], [address], [place], [place] [address]   POC Amphetamine UR 06/22/2021 None Detected  NONE DETECTED (Cut Off Level 1000 ng/mL) Final   POC Secobarbital (BAR) 06/22/2021 None Detected  NONE DETECTED (Cut Off Level 300 ng/mL) Final   POC Buprenorphine (BUP) 06/22/2021 None Detected  NONE DETECTED (Cut Off Level 10 ng/mL) Final   POC Oxazepam (BZO) 06/22/2021 Positive (A)  NONE DETECTED (Cut Off Level 300 ng/mL) Final   POC Cocaine UR 06/22/2021 None Detected  NONE DETECTED (Cut Off Level 300 ng/mL) Final   POC Methamphetamine UR 06/22/2021 None Detected  NONE DETECTED (Cut Off Level 1000 ng/mL) Final   POC Morphine 06/22/2021 None Detected  NONE DETECTED (Cut Off Level 300 ng/mL) Final   POC Methadone UR 06/22/2021 None Detected  NONE DETECTED (Cut Off Level 300 ng/mL) Final   POC Oxycodone UR 06/22/2021 None Detected  NONE DETECTED (Cut Off Level 100 ng/mL) Final   POC Marijuana UR 06/22/2021 Positive (A)  NONE DETECTED (Cut Off Level 50 ng/mL) Final   SARSCOV2ONAVIRUS 2 AG 06/22/2021 NEGATIVE  NEGATIVE Final   Comment: (NOTE) SARS-CoV-2 antigen NOT DETECTED.   Negative results are presumptive.  Negative results do not preclude SARS-CoV-2 infection and should not be used as the sole basis for treatment or other patient management decisions, including infection  control decisions, particularly in the presence of clinical signs and  symptoms consistent with COVID-19, or  in those who have been in contact with the virus.  Negative results must be combined with clinical observations, patient history, and epidemiological information. The expected result is Negative.  Fact Sheet for Patients: https://www.jennings-kim.com/  Fact Sheet for Healthcare Providers: https://alexander-rogers.biz/  This test is not yet approved or cleared by the [place] FDA and  has been authorized for detection and/or diagnosis of SARS-CoV-2 by FDA under an Emergency Use Authorization (EUA).  This EUA will remain in effect (meaning this test can be used) for the duration of  the COV                          ID-19 declaration under Section 564(b)(1) of the Act, 21 U.S.C. section 360bbb-3(b)(1), unless the authorization is terminated or revoked sooner.     Cholesterol 06/22/2021 115  0 - 200 mg/dL Final   Triglycerides 78/93/8101 95  <150 mg/dL Final   HDL 75/10/2583 37 (L)  >40 mg/dL Final   Total CHOL/HDL Ratio 06/22/2021 3.1  RATIO Final   VLDL 06/22/2021 19  0 - 40 mg/dL Final   LDL Cholesterol 06/22/2021 59  0 - 99 mg/dL Final   Comment:        Total Cholesterol/HDL:CHD Risk Coronary Heart Disease Risk Table                     Men   Women  1/2 Average Risk   3.4   3.3  Average Risk       5.0   4.4  2 X Average Risk   9.6   7.1  3 X Average Risk  23.4   11.0        Use the calculated Patient Ratio above and the CHD Risk Table to determine the patient's CHD Risk.        ATP III  CLASSIFICATION (LDL):  <100     mg/dL   Optimal  638-756  mg/dL   Near or Above                    Optimal  130-159  mg/dL   Borderline  433-295  mg/dL   High  >188     mg/dL   Very High Performed at [facility], [address], [place], [place] [address]     Blood Alcohol level:  No results found for: "ETH"  Metabolic Disorder Labs: No results found for: "HGBA1C", "MPG" No results found for: "PROLACTIN" Lab Results  Component Value Date    CHOL 115 06/22/2021   TRIG 95 06/22/2021   HDL 37 (L) 06/22/2021   CHOLHDL 3.1 06/22/2021   VLDL 19 06/22/2021   LDLCALC 59 06/22/2021    Therapeutic Lab Levels: No results found for: "LITHIUM" No results found for: "VALPROATE" No results found for: "CBMZ"  Physical Findings   PHQ2-9    Flowsheet Row ED from 06/22/2021 in [facility] Office Visit from 09/28/2020 in [facility]  PHQ-2 Total Score 4 1  PHQ-9 Total Score 19 --      Flowsheet Row ED from 06/22/2021 in [facility]  C-SSRS RISK CATEGORY No Risk        Musculoskeletal  Strength & Muscle Tone: within normal limits Gait & Station: normal Patient leans: N/A  Psychiatric Specialty Exam  Presentation  General Appearance: Appropriate for Environment; Casual  Eye Contact:Good  Speech:Clear and Coherent; Normal Rate  Speech Volume:Normal  Handedness:Right   Mood and Affect  Mood:-- ("great")  Affect:Appropriate; Congruent (bright)   Thought Process  Thought Processes:Coherent; Goal Directed; Linear  Descriptions of Associations:Intact  Orientation:Full (Time, Place and Person)  Thought Content:WDL; Logical  Diagnosis of Schizophrenia or Schizoaffective disorder in past: No    Hallucinations:Hallucinations: None  Ideas of Reference:None  Suicidal Thoughts:Suicidal Thoughts: No  Homicidal Thoughts:Homicidal Thoughts: No   Sensorium  Memory:Immediate Good; Recent Good; Remote Good  Judgment:Good  Insight:Good   Executive Functions  Concentration:Good  Attention Span:Good  Recall:Good  Fund of Knowledge:Good  Language:Good   Psychomotor Activity  Psychomotor Activity:Psychomotor Activity: Normal   Assets  Assets:Communication Skills; Desire for Improvement; Financial Resources/Insurance; Housing; Physical Health; Social Support   Sleep  Sleep:Sleep: Fair   No data recorded   Physical Exam   Physical Exam Constitutional:      Appearance: Normal appearance. He is normal weight.  HENT:     Head: Normocephalic and atraumatic.  Eyes:     Extraocular Movements: Extraocular movements intact.  Pulmonary:     Effort: Pulmonary effort is normal.  Neurological:     General: No focal deficit present.     Mental Status: He is alert and oriented to person, place, and time.  Psychiatric:        Attention and Perception: Attention and perception normal.        Speech: Speech normal.        Behavior: Behavior normal. Behavior is cooperative.        Thought Content: Thought content normal.    Review of Systems  Constitutional:  Negative for chills and fever.  HENT:  Negative for hearing loss.   Eyes:  Negative for discharge and redness.       Dry eyes  Respiratory:  Negative for cough.   Cardiovascular:  Negative for chest pain.  Gastrointestinal:  Negative for abdominal pain.  Musculoskeletal:  Negative for myalgias.  Neurological:  Positive for headaches.  Psychiatric/Behavioral:  Positive for substance abuse. Negative for depression and suicidal ideas.    Blood pressure 121/85, pulse 97, temperature 98.7 F (37.1 C), temperature source Oral, resp. rate 18, SpO2 100 %. There is no height or weight on file to calculate BMI.  Treatment Plan Summary:  Rojelio Uhrich 26 y.o., male patient who initially presented to [facility] as a recommendation from his provider due to Xanax use on 06/22/20.  He was admitted to the [facility] unit for crisis stabilization/detox and was started on librium taper and CIWA protocol.  Per chart review patient has a psychiatric history of MDD, GAD, ADHD, cannabis use, benzodiazepine misuse and stimulant misuse.  He has psychiatric services in place with [facility].  His UDS upon admission is positive for benzodiazepines and marijuana.  Most recent CIWA 0.  Patient tolerating Librium taper well.  No SI/HI/AVH.  Patient made appropriate for continued admission for the  [facility] for detox/crisis stabilization.  Anticipate discharge tomorrow after completion of taper.    Benzodiazepine withdrawal; benzodiazepine dependence Patient does not have a history of seizures or DT. Continue librium taper-scheduled to end tomorrow CIWA per protocol Thiamine supplement Encouraged cessation Plan to discuss with CSW for resources for substance use   MDD, GAD-stable Currently patient is on Pristiq 50 mg daily, Remeron 15 mg qHS.  Patient denied SI/HI/AVH. Continued home Pristiq and Remeron per above.   Chronic pancreatic insufficiency since childhood. Restart patient on home medications.  Fenofibrate 160 mg daily Folate and multivitamin supplement Lipase enzyme Pantoprazole 40 mg daily Rosuvastatin 20 mg qHS Sulfate 1 g twice daily   Substance use disorder Prescription stimulant misuse, cannabis use, tobacco use, benzo per above Home Concerta was discontinued Encourage cessation NRT PRN   IBS Continued home Bentyl as needed  Dry eyes -prn eye drops  Dispo: anticipate dc tomorrow after completion of taper   Estella Husk, MD 06/24/2021 1:26 PM

## 2021-06-24 NOTE — ED Notes (Signed)
Patient is resting comfortably. 

## 2021-06-24 NOTE — ED Notes (Signed)
Pt is in the bed resting. Respirations are even and unlabored. No acute distress noted. Will continue to monitor for safety 

## 2021-06-24 NOTE — ED Notes (Signed)
Patient awake and alert on unit.  Presently sitting in dayroom eating breakfast.  He has mild anxiety however no evidence of withdrawal at this time.  Will monitor and provide support as needed.

## 2021-06-25 MED ORDER — HYDROXYZINE PAMOATE 25 MG PO CAPS: 25.0000 mg | ORAL_CAPSULE | Freq: Three times a day (TID) | ORAL | 0 refills | Status: AC | PRN

## 2021-06-28 DIAGNOSIS — F900 Attention-deficit hyperactivity disorder, predominantly inattentive type: Secondary | ICD-10-CM | POA: Diagnosis not present

## 2021-06-28 DIAGNOSIS — F411 Generalized anxiety disorder: Secondary | ICD-10-CM | POA: Diagnosis not present

## 2021-06-28 DIAGNOSIS — F1310 Sedative, hypnotic or anxiolytic abuse, uncomplicated: Secondary | ICD-10-CM | POA: Diagnosis not present

## 2021-06-28 DIAGNOSIS — F331 Major depressive disorder, recurrent, moderate: Secondary | ICD-10-CM | POA: Diagnosis not present

## 2021-07-05 DIAGNOSIS — F900 Attention-deficit hyperactivity disorder, predominantly inattentive type: Secondary | ICD-10-CM | POA: Diagnosis not present

## 2021-07-05 DIAGNOSIS — F411 Generalized anxiety disorder: Secondary | ICD-10-CM | POA: Diagnosis not present

## 2021-07-05 DIAGNOSIS — F13239 Sedative, hypnotic or anxiolytic dependence with withdrawal, unspecified: Secondary | ICD-10-CM | POA: Diagnosis not present

## 2021-07-05 DIAGNOSIS — F331 Major depressive disorder, recurrent, moderate: Secondary | ICD-10-CM | POA: Diagnosis not present

## 2021-07-08 IMAGING — MR MR ABDOMEN WO/W CM MRCP
16 of 23 series · 28 of 48 positions shown · IV contrast (multihance)
Comparison: CT AP 02/14/2012

CLINICAL DATA: Epigastric pain.  History of pancreatitis.

EXAM:
MRI ABDOMEN WITHOUT AND WITH CONTRAST (INCLUDING MRCP)
TECHNIQUE: Multiplanar multisequence MR imaging of the abdomen was performed
both before and after the administration of intravenous contrast.
Heavily T2-weighted images of the biliary and pancreatic ducts were
obtained, and three-dimensional MRCP images were rendered by post
processing.
CONTRAST:  17mL MULTIHANCE GADOBENATE DIMEGLUMINE 529 MG/ML IV SOLN

[Series 3: cor haste · coronal · 5.0mm · 0.76mm/px · 1 of 37 slices shown]
[im 1/37]
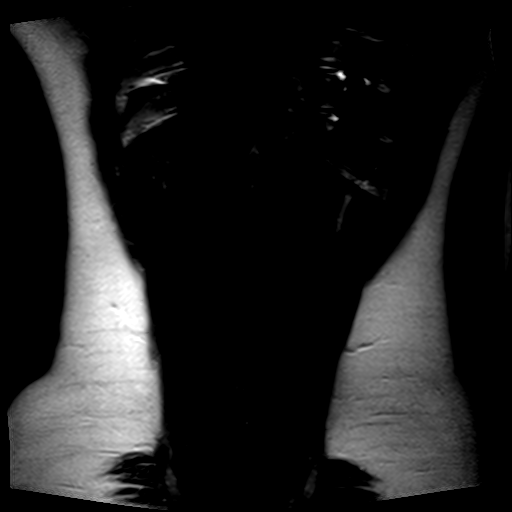

[Series 4: bSSFP · coronal · 5.0mm · 0.78mm/px · 1 of 35 slices shown (1 of 2)]
[im 1/35]
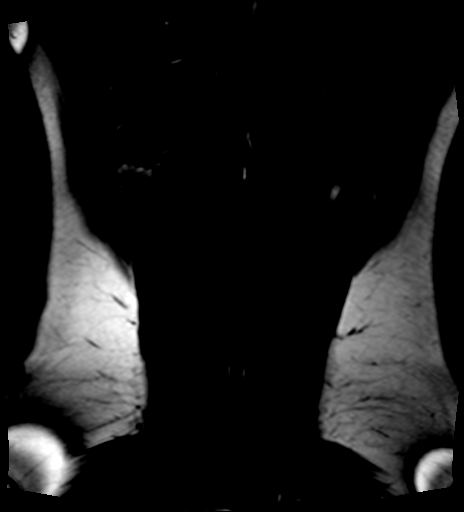

[Series 5: T2 · coronal · 3.0mm · 0.74mm/px · 1 of 17 slices shown (1 of 3)]
[im 1/17]
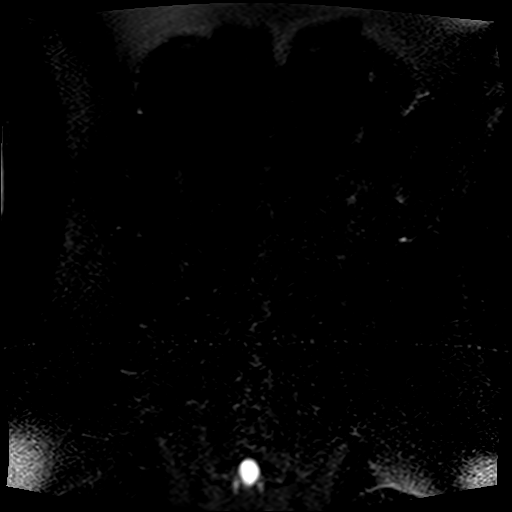

[Series 6: T2 · coronal · 3.0mm · 0.74mm/px · 1 of 66 slices shown (2 of 3)]
[im 1/66]
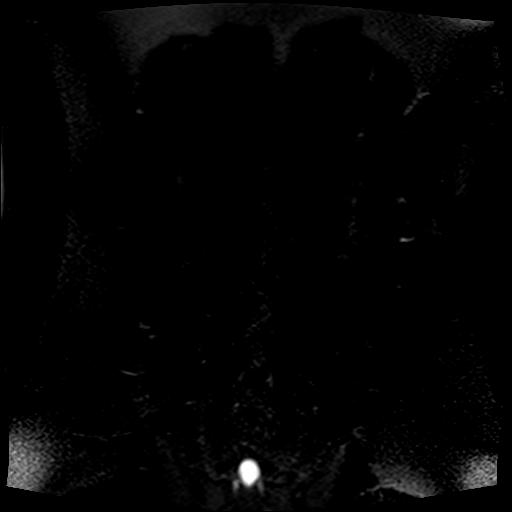

[Series 7: axial haste · axial · 6.0mm · 0.74mm/px · 1 of 49 slices shown]
[im 1/49]
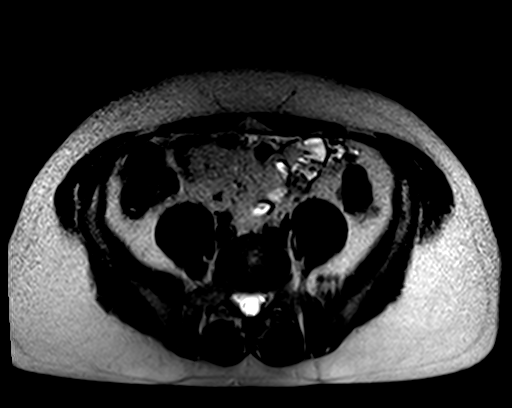

[Series 8: T1 · axial · 6.0mm · 0.74mm/px · z∈[-138,+146]mm · 2 of 88 slices shown]
[im 1/88]
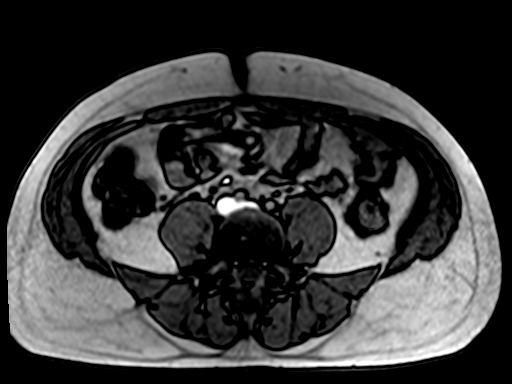
[im 88/88]
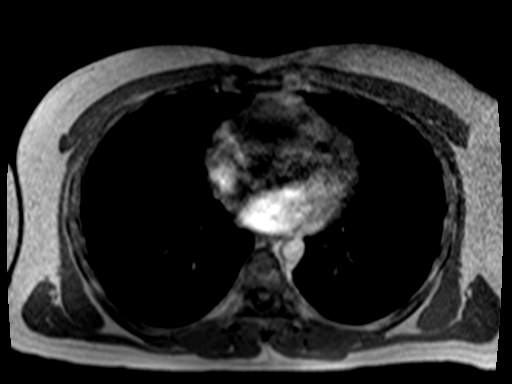

[Series 10: [id number]_fs_sag_thick_[id number]_384 · sagittal · 50.0mm · 0.78mm/px · 1 of 3 slices shown]
[im 1/3]
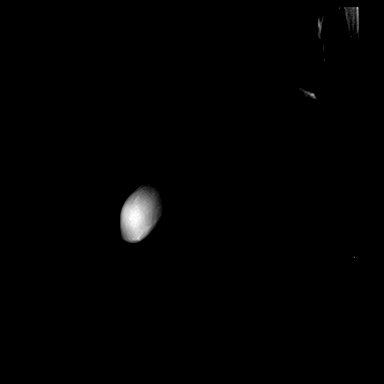

[Series 11: T2 · axial · 6.0mm · 1.19mm/px · 1 of 42 slices shown (3 of 3)]
[im 1/42]
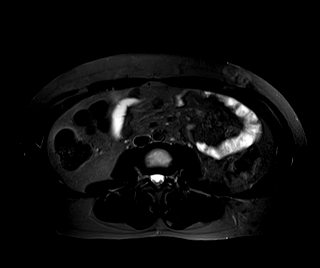

[Series 12: ep2d_diff_b50_500_800_p2_trig · axial · 6.0mm · 1.98mm/px · z∈[-158,+159]mm · 3 of 135 slices shown]
[im 1/135]
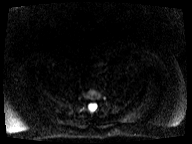
[im 68/135]
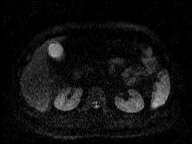
[im 135/135]
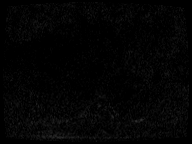

[Series 13: ep2d_diff_b50_500_800_p2_trig_adc · axial · 6.0mm · 1.98mm/px · 1 of 44 slices shown]
[im 1/44]
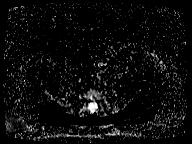

[Series 14: bSSFP · axial · 4.0mm · 0.74mm/px · z∈[-144,+152]mm · 2 of 75 slices shown (2 of 2)]
[im 1/75]
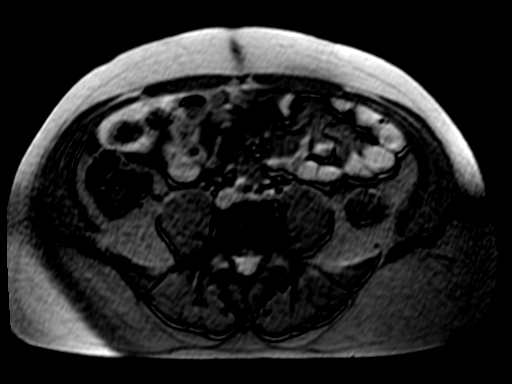
[im 75/75]
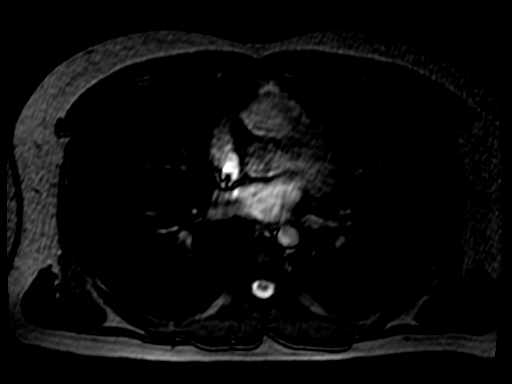

[Series 15: T1 dynamic · axial · non-contrast · 2.5mm · 0.76mm/px · z∈[-133,+124]mm · 3 of 104 slices shown]
[im 1/104]
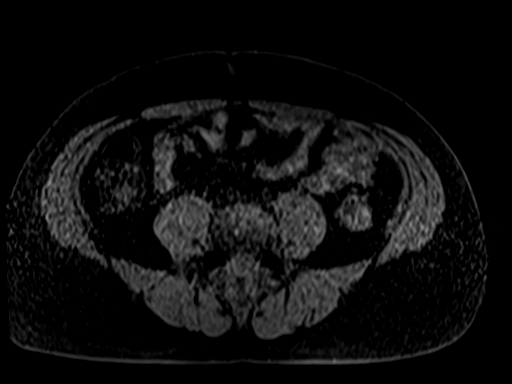
[im 52/104]
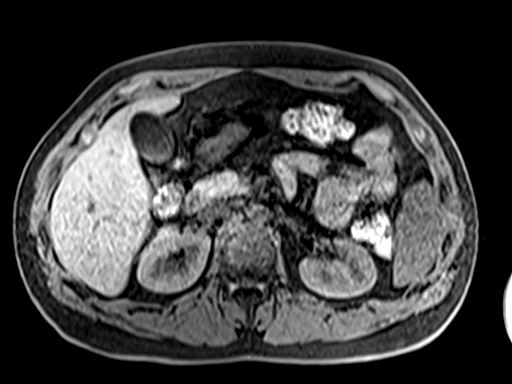
[im 104/104]
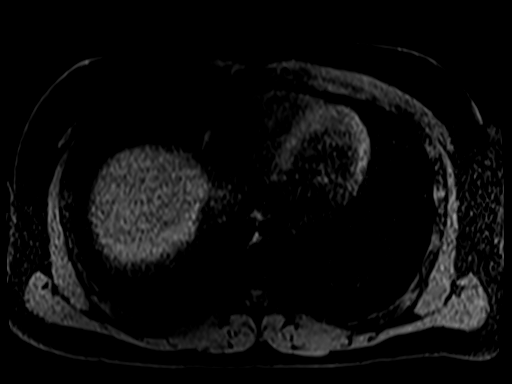

[Series 16: T1 dynamic post-contrast · axial · 2.5mm · 0.76mm/px · z∈[-133,+124]mm · 3 of 104 slices shown (1 of 4)]
[im 1/104]
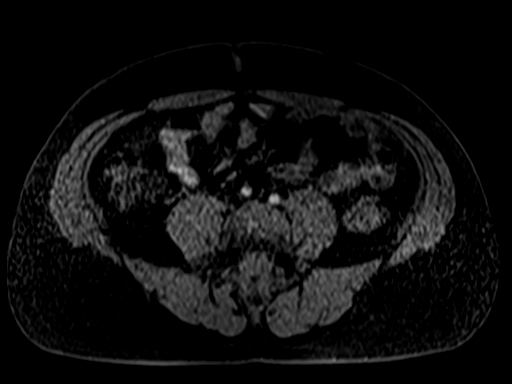
[im 52/104]
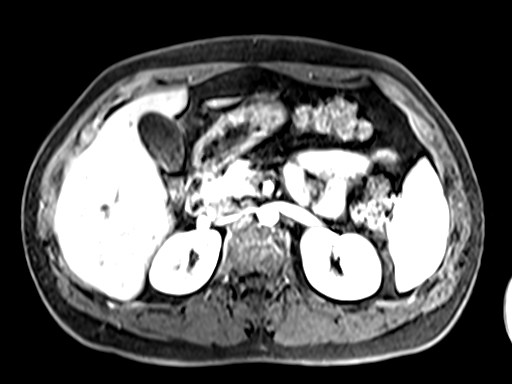
[im 104/104]
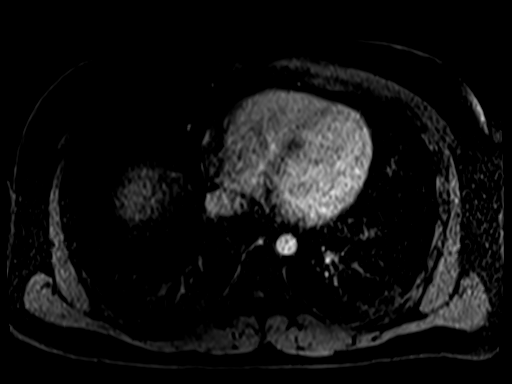

[Series 17: T1 dynamic post-contrast · axial · 2.5mm · 0.76mm/px · z∈[-133,+124]mm · 3 of 104 slices shown (2 of 4)]
[im 1/104]
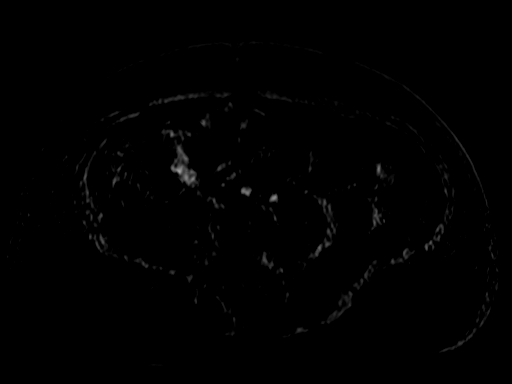
[im 52/104]
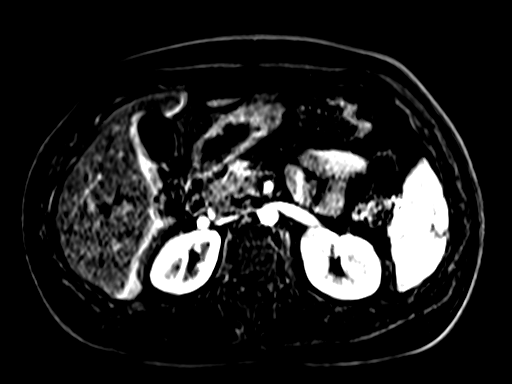
[im 104/104]
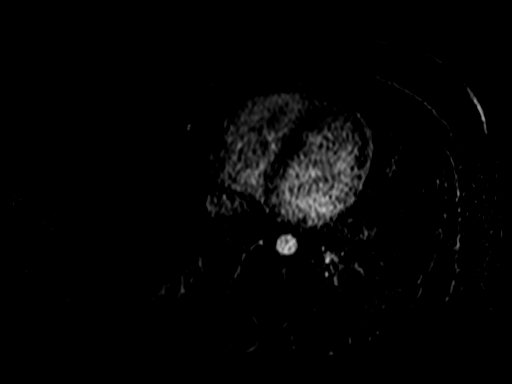

[Series 18: T1 dynamic post-contrast · axial · 2.5mm · 0.76mm/px · z∈[-133,+124]mm · 3 of 104 slices shown (3 of 4)]
[im 1/104]
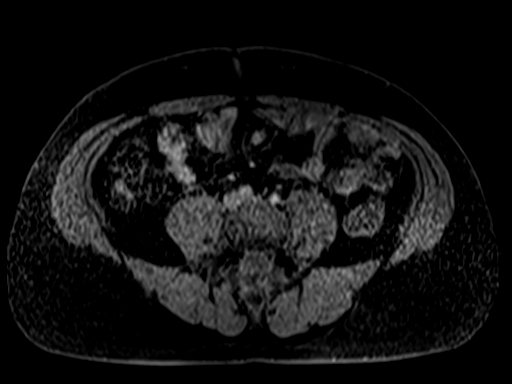
[im 52/104]
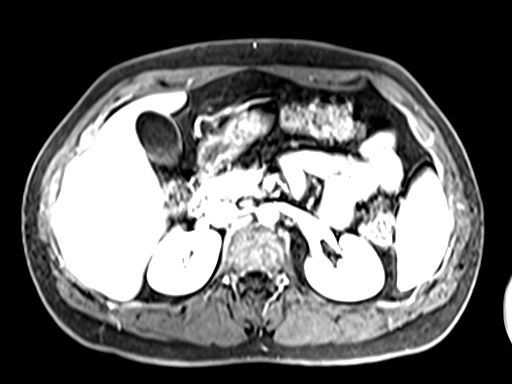
[im 104/104]
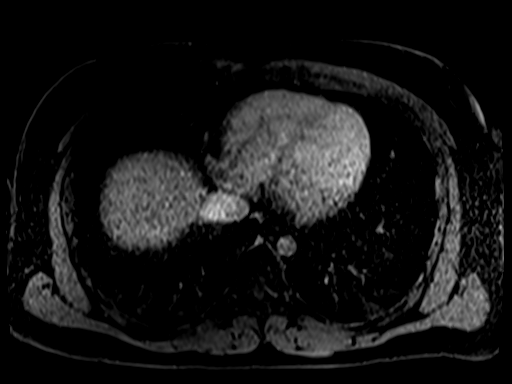

[Series 19: T1 dynamic post-contrast · axial · 2.5mm · 0.76mm/px · 1 of 104 slices shown (4 of 4)]
[im 1/104]
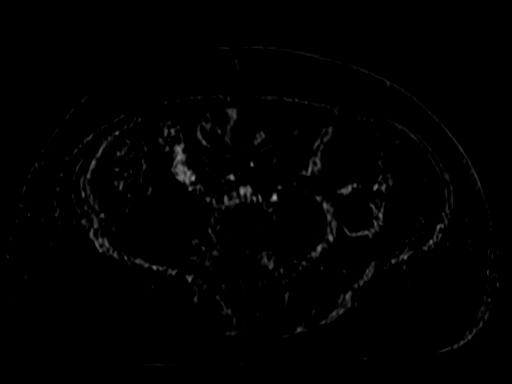

[28 of 48 positions shown; findings below may reference images not displayed]

FINDINGS: Lower chest: No acute findings.

Hepatobiliary: There are several small hemangiomas within the right
hepatic lobe measuring less than 1 cm. No suspicious enhancing liver
lesions. The in a high gallbladder normal. No gallstones or
gallbladder wall thickening. No signs of bile duct dilatation. The
common bile duct measures up to 4 mm. No choledocholithiasis

Pancreas: Pancreas divisum anatomy is suspected. No mass,
inflammatory changes, or other parenchymal abnormality identified.

Spleen:  Within normal limits in size and appearance.

Adrenals/Urinary Tract: Normal adrenal glands. Cyst within the is
anterior cortex of right kidney measures 7 mm. No mass or
hydronephrosis identified bilaterally.

Stomach/Bowel: Stomach appears normal. Visualized bowel loops in the
abdomen are unremarkable.

Vascular/Lymphatic: No pathologically enlarged lymph nodes
identified. No abdominal aortic aneurysm demonstrated.

Other:  No free fluid

Musculoskeletal: No suspicious bone lesions identified.
IMPRESSION: 1. No acute findings within the abdomen.
2. No gallstones or bile duct dilatation. No evidence for
choledocholithiasis.
3. Pancreas divisum anatomy is suspected.
4. Small hemangiomas within the right hepatic lobe.

## 2021-07-12 DIAGNOSIS — F411 Generalized anxiety disorder: Secondary | ICD-10-CM | POA: Diagnosis not present

## 2021-07-12 DIAGNOSIS — F331 Major depressive disorder, recurrent, moderate: Secondary | ICD-10-CM | POA: Diagnosis not present

## 2021-07-12 DIAGNOSIS — F900 Attention-deficit hyperactivity disorder, predominantly inattentive type: Secondary | ICD-10-CM | POA: Diagnosis not present

## 2021-07-12 DIAGNOSIS — F13239 Sedative, hypnotic or anxiolytic dependence with withdrawal, unspecified: Secondary | ICD-10-CM | POA: Diagnosis not present

## 2021-07-20 DIAGNOSIS — F1310 Sedative, hypnotic or anxiolytic abuse, uncomplicated: Secondary | ICD-10-CM | POA: Diagnosis not present

## 2021-07-20 DIAGNOSIS — F900 Attention-deficit hyperactivity disorder, predominantly inattentive type: Secondary | ICD-10-CM | POA: Diagnosis not present

## 2021-07-20 DIAGNOSIS — F319 Bipolar disorder, unspecified: Secondary | ICD-10-CM | POA: Diagnosis not present

## 2021-07-20 DIAGNOSIS — E781 Pure hyperglyceridemia: Secondary | ICD-10-CM | POA: Diagnosis not present

## 2021-07-20 DIAGNOSIS — F331 Major depressive disorder, recurrent, moderate: Secondary | ICD-10-CM | POA: Diagnosis not present

## 2021-07-20 DIAGNOSIS — I10 Essential (primary) hypertension: Secondary | ICD-10-CM | POA: Diagnosis not present

## 2021-07-20 DIAGNOSIS — F411 Generalized anxiety disorder: Secondary | ICD-10-CM | POA: Diagnosis not present

## 2021-07-20 DIAGNOSIS — F902 Attention-deficit hyperactivity disorder, combined type: Secondary | ICD-10-CM | POA: Diagnosis not present

## 2021-07-29 DIAGNOSIS — F411 Generalized anxiety disorder: Secondary | ICD-10-CM | POA: Diagnosis not present

## 2021-08-12 DIAGNOSIS — S92515A Nondisplaced fracture of proximal phalanx of left lesser toe(s), initial encounter for closed fracture: Secondary | ICD-10-CM | POA: Diagnosis not present

## 2021-08-28 DIAGNOSIS — Z23 Encounter for immunization: Secondary | ICD-10-CM | POA: Diagnosis not present

## 2021-08-28 DIAGNOSIS — S0502XA Injury of conjunctiva and corneal abrasion without foreign body, left eye, initial encounter: Secondary | ICD-10-CM | POA: Diagnosis not present

## 2021-08-31 DIAGNOSIS — F900 Attention-deficit hyperactivity disorder, predominantly inattentive type: Secondary | ICD-10-CM | POA: Diagnosis not present

## 2021-08-31 DIAGNOSIS — F331 Major depressive disorder, recurrent, moderate: Secondary | ICD-10-CM | POA: Diagnosis not present

## 2021-08-31 DIAGNOSIS — F411 Generalized anxiety disorder: Secondary | ICD-10-CM | POA: Diagnosis not present

## 2021-08-31 DIAGNOSIS — F1310 Sedative, hypnotic or anxiolytic abuse, uncomplicated: Secondary | ICD-10-CM | POA: Diagnosis not present

## 2021-09-04 DIAGNOSIS — S93502A Unspecified sprain of left great toe, initial encounter: Secondary | ICD-10-CM | POA: Diagnosis not present

## 2021-09-21 DIAGNOSIS — F900 Attention-deficit hyperactivity disorder, predominantly inattentive type: Secondary | ICD-10-CM | POA: Diagnosis not present

## 2021-09-21 DIAGNOSIS — F411 Generalized anxiety disorder: Secondary | ICD-10-CM | POA: Diagnosis not present

## 2021-09-21 DIAGNOSIS — F1310 Sedative, hypnotic or anxiolytic abuse, uncomplicated: Secondary | ICD-10-CM | POA: Diagnosis not present

## 2021-09-21 DIAGNOSIS — F331 Major depressive disorder, recurrent, moderate: Secondary | ICD-10-CM | POA: Diagnosis not present

## 2021-09-23 DIAGNOSIS — M79672 Pain in left foot: Secondary | ICD-10-CM | POA: Diagnosis not present

## 2021-10-20 DIAGNOSIS — R399 Unspecified symptoms and signs involving the genitourinary system: Secondary | ICD-10-CM | POA: Diagnosis not present

## 2021-10-20 DIAGNOSIS — L0231 Cutaneous abscess of buttock: Secondary | ICD-10-CM | POA: Diagnosis not present

## 2021-10-20 DIAGNOSIS — M79674 Pain in right toe(s): Secondary | ICD-10-CM | POA: Diagnosis not present

## 2021-11-19 DIAGNOSIS — F411 Generalized anxiety disorder: Secondary | ICD-10-CM | POA: Diagnosis not present

## 2021-11-19 DIAGNOSIS — F900 Attention-deficit hyperactivity disorder, predominantly inattentive type: Secondary | ICD-10-CM | POA: Diagnosis not present

## 2021-11-19 DIAGNOSIS — F331 Major depressive disorder, recurrent, moderate: Secondary | ICD-10-CM | POA: Diagnosis not present

## 2022-05-31 ENCOUNTER — Emergency Department (HOSPITAL_BASED_OUTPATIENT_CLINIC_OR_DEPARTMENT_OTHER)
Admission: EM | Admit: 2022-05-31 | Discharge: 2022-05-31 | Disposition: A | Discharge status: Home / Self Care | Payer: Self-pay | Attending: Emergency Medicine | Admitting: Emergency Medicine

## 2022-05-31 ENCOUNTER — Other Ambulatory Visit: Payer: Self-pay

## 2022-05-31 ENCOUNTER — Encounter (HOSPITAL_BASED_OUTPATIENT_CLINIC_OR_DEPARTMENT_OTHER): Payer: Self-pay | Admitting: Emergency Medicine

## 2022-05-31 ENCOUNTER — Emergency Department (HOSPITAL_BASED_OUTPATIENT_CLINIC_OR_DEPARTMENT_OTHER): Payer: Self-pay

## 2022-05-31 DIAGNOSIS — Y990 Civilian activity done for income or pay: Secondary | ICD-10-CM | POA: Diagnosis not present

## 2022-05-31 DIAGNOSIS — S0990XA Unspecified injury of head, initial encounter: Secondary | ICD-10-CM

## 2022-05-31 DIAGNOSIS — W228XXA Striking against or struck by other objects, initial encounter: Secondary | ICD-10-CM | POA: Diagnosis not present

## 2022-05-31 DIAGNOSIS — S060X0A Concussion without loss of consciousness, initial encounter: Secondary | ICD-10-CM | POA: Insufficient documentation

## 2022-05-31 DIAGNOSIS — Y9389 Activity, other specified: Secondary | ICD-10-CM | POA: Insufficient documentation

## 2022-05-31 MED ORDER — IBUPROFEN 800 MG PO TABS
800.0000 mg | ORAL_TABLET | Freq: Once | ORAL | Status: AC
  Administered 2022-05-31: 800 mg via ORAL
  Filled 2022-05-31: qty 1

## 2022-05-31 NOTE — Discharge Instructions (Addendum)
Follow-up with your PCP to ensure resolution, can alternate Tylenol and ibuprofen for symptomatic management.   Primary mainstay of treatment is cognitive rest.

## 2022-05-31 NOTE — ED Notes (Signed)
Pt A&OX4 ambulatory at d/c with independent steady gait. Pt verbalized understanding of d/c instructions and follow up care. 

## 2022-05-31 NOTE — ED Triage Notes (Signed)
Patient arrives ambulatory by POV c/o head pain. Monday night about 1am stood up and hit right side of head on a metal sign at work. No LOC however when it happened reports seeing stars for a couple mins. Now having sensitivity to light and sound. Denies any N/V. Took tylenol about 12 today.

## 2022-05-31 NOTE — ED Provider Notes (Signed)
[facility] Provider Note   CSN: [id number] Arrival date & time: 05/31/22  1326     History  Chief Complaint  Patient presents with   Head Injury    Jeffery Morales is a 27 y.o. male.   Head Injury Associated symptoms: headache      27 year old male presenting to the emergency department after a head injury.  The patient states that he was at work and stood up and hit the back of his head on a metal sign.  No loss of consciousness.  He sustained no laceration or hematoma.  He states that he felt lightheaded and was "seeing stars" for few minutes.  He initially thought he was fine but then developed headache which has persisted every day.  He describes a 6 out of 10 in severity.  No nausea or vomiting.  He is not on anticoagulation.  He endorses light sensitivity and sound sensitivity.  He denies any other injuries or complaints.  He arrived to the emergency department GCS 15, ABC intact.  Home Medications Prior to Admission medications   Medication Sig Start Date End Date Taking? Authorizing Provider  cetirizine (ZYRTEC) 10 MG tablet Take 10 mg by mouth daily.    [provider]  desvenlafaxine (PRISTIQ) 50 MG 24 hr tablet SMARTSIG:1 Tablet(s) By Mouth Every Evening Patient taking differently: 50 mg daily. 09/28/20   Joan Flores, NP  dicyclomine (BENTYL) 10 MG capsule Take 10 mg by mouth in the morning, at noon, and at bedtime. 06/06/20   [provider]  fenofibrate micronized (LOFIBRA) 200 MG capsule Take 200 mg by mouth daily. 05/19/20   [provider]  folic acid (FOLVITE) 1 MG tablet Take 1 tablet by mouth daily. 11/25/19   [provider]  hydrOXYzine (VISTARIL) 25 MG capsule Take 1 capsule (25 mg total) by mouth 3 (three) times daily as needed. 06/25/21   Estella Husk, MD  icosapent Ethyl (VASCEPA) 1 g capsule 2 g 2 (two) times daily. 10/18/19   [provider]  methylphenidate 18  MG PO CR tablet Take 18 mg by mouth daily as needed (For ADHD).    [provider]  metoprolol succinate (TOPROL-XL) 25 MG 24 hr tablet Take 25 mg by mouth daily. 11/25/19   [provider]  mirtazapine (REMERON) 15 MG tablet Take 15 mg by mouth at bedtime. 04/27/21   [provider]  Multiple Vitamin (DAILY VITES) tablet Take 1 tablet by mouth daily. 11/25/19   [provider]  omeprazole (PRILOSEC) 40 MG capsule Take 40 mg by mouth every morning. 08/26/20   [provider]  ondansetron (ZOFRAN) 4 MG tablet Take 4 mg by mouth daily as needed for nausea or vomiting. 07/20/20   [provider]  Polyethyl Glycol-Propyl Glycol (SYSTANE OP) Place 2 drops into both eyes in the morning, at noon, and at bedtime.    [provider]  rosuvastatin (CRESTOR) 20 MG tablet Take 20 mg by mouth at bedtime. 02/10/19   [provider]  sucralfate (CARAFATE) 1 g tablet Take 1 g by mouth 2 (two) times daily. 07/19/20   [provider]  ZENPEP 40000-126000 units CPEP Take 2 capsules by mouth in the morning, at noon, and at bedtime. 09/04/20   [provider]      Allergies    Patient has no known allergies.    Review of Systems   Review of Systems  Eyes:  Positive for  photophobia.  Neurological:  Positive for headaches.  All other systems reviewed and are negative.   Physical Exam Updated Vital Signs BP (!) 125/90   Pulse 77   Temp 97.9 F (36.6 C) (Oral)   Resp 16   Ht 5\' 11"  (1.803 m)   Wt 88.5 kg   SpO2 100%   BMI 27.20 kg/m  Physical Exam Vitals and nursing note reviewed.  Constitutional:      General: He is not in acute distress. HENT:     Head: Normocephalic and atraumatic.  Eyes:     Conjunctiva/sclera: Conjunctivae normal.     Pupils: Pupils are equal, round, and reactive to light.  Cardiovascular:     Rate and Rhythm: Normal rate and regular rhythm.  Pulmonary:     Effort: Pulmonary effort is normal.  No respiratory distress.  Abdominal:     General: There is no distension.     Tenderness: There is no guarding.  Musculoskeletal:        General: No deformity or signs of injury.     Cervical back: Neck supple.  Skin:    Findings: No lesion or rash.  Neurological:     General: No focal deficit present.     Mental Status: He is alert. Mental status is at baseline.     Comments: MENTAL STATUS EXAM:    Orientation: Alert and oriented to person, place and time.  Memory: Cooperative, follows commands well.  Language: Speech is clear and language is normal.   CRANIAL NERVES:    CN 2 (Optic): Visual fields intact to confrontation.  CN 3,4,6 (EOM): Pupils equal and reactive to light. Full extraocular eye movement without nystagmus.  CN 5 (Trigeminal): Facial sensation is normal, no weakness of masticatory muscles.  CN 7 (Facial): No facial weakness or asymmetry.  CN 8 (Auditory): Auditory acuity grossly normal.  CN 9,10 (Glossophar): The uvula is midline, the palate elevates symmetrically.  CN 11 (spinal access): Normal sternocleidomastoid and trapezius strength.  CN 12 (Hypoglossal): The tongue is midline. No atrophy or fasciculations.Marland Kitchen   MOTOR:  Muscle Strength: 5/5RUE, 5/5LUE, 5/5RLE, 5/5LLE.   COORDINATION:   Intact finger-to-nose, no tremore.   SENSATION:   Intact to light touch all four extremities.        ED Results / Procedures / Treatments   Labs (all labs ordered are listed, but only abnormal results are displayed) Labs Reviewed - No data to display  EKG None  Radiology CT Head Wo Contrast  Result Date: 05/31/2022 CLINICAL DATA:  Head trauma, repeat vomiting (Age 46-64y) EXAM: CT HEAD WITHOUT CONTRAST TECHNIQUE: Contiguous axial images were obtained from the base of the skull through the vertex without intravenous contrast. RADIATION DOSE REDUCTION: This exam was performed according to the departmental dose-optimization program which includes automated exposure  control, adjustment of the mA and/or kV according to patient size and/or use of iterative reconstruction technique. COMPARISON:  09/13/2014. FINDINGS: Brain: No evidence of acute infarction, hemorrhage, hydrocephalus, extra-axial collection or mass lesion/mass effect. Vascular: No hyperdense vessel. Skull: No acute fracture. Sinuses/Orbits: Paranasal sinus mucosal thickening. No acute orbital findings. Other: No mastoid effusions. IMPRESSION: No evidence of acute intracranial abnormality. Electronically Signed   By: Feliberto Harts M.D.   On: 05/31/2022 14:00    Procedures Procedures    Medications Ordered in ED Medications  ibuprofen (ADVIL) tablet 800 mg (has no administration in time range)    ED Course/ Medical Decision Making/ A&P  Medical Decision Making Risk Prescription drug management.     27 year old male presenting to the emergency department after a head injury.  The patient states that he was at work and stood up and hit the back of his head on a metal sign.  No loss of consciousness.  He sustained no laceration or hematoma.  He states that he felt lightheaded and was "seeing stars" for few minutes.  He initially thought he was fine but then developed headache which has persisted every day.  He describes a 6 out of 10 in severity.  No nausea or vomiting.  He is not on anticoagulation.  He endorses light sensitivity and sound sensitivity.  He denies any other injuries or complaints.  He arrived to the emergency department GCS 15, ABC intact.    On arrival, the patient was vitally stable, presenting with a normal neurologic exam.  Having symptoms of a concussion post head injury.  Additional differential includes skull fracture, subdural hematoma.  CT imaging of the head was performed which was ultimately negative for acute traumatic injury. FINDINGS:  Brain: No evidence of acute infarction, hemorrhage, hydrocephalus,  extra-axial collection or mass  lesion/mass effect.    Vascular: No hyperdense vessel.    Skull: No acute fracture.    Sinuses/Orbits: Paranasal sinus mucosal thickening. No acute orbital  findings.    Other: No mastoid effusions.    IMPRESSION:  No evidence of acute intracranial abnormality.    Counseled the patient regarding signs of concussion, management and supportive care at home.  Advised Tylenol and Motrin for management and commended outpatient follow-up with his PCP to ensure resolution.   Final Clinical Impression(s) / ED Diagnoses Final diagnoses:  Concussion without loss of consciousness, initial encounter  Minor head injury, initial encounter    Rx / DC Orders ED Discharge Orders     None         Ernie Avena, MD 05/31/22 1523
